# Patient Record
Sex: Male | Born: 1995 | Race: Black or African American | Hispanic: No | Marital: Single | Smoking: Current every day smoker
Health system: Southern US, Community
[De-identification: ages and names within clinical notes are randomized; demographics above are authoritative.]

## PROBLEM LIST (undated history)

## (undated) DIAGNOSIS — I10 Essential (primary) hypertension: Secondary | ICD-10-CM

## (undated) DIAGNOSIS — N200 Calculus of kidney: Secondary | ICD-10-CM

## (undated) HISTORY — PX: ANTERIOR CRUCIATE LIGAMENT REPAIR: SHX115

---
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
## 2016-09-18 ENCOUNTER — Emergency Department (HOSPITAL_BASED_OUTPATIENT_CLINIC_OR_DEPARTMENT_OTHER): Payer: Medicaid Other

## 2016-09-18 ENCOUNTER — Encounter (HOSPITAL_BASED_OUTPATIENT_CLINIC_OR_DEPARTMENT_OTHER): Payer: Self-pay

## 2016-09-18 ENCOUNTER — Emergency Department (HOSPITAL_BASED_OUTPATIENT_CLINIC_OR_DEPARTMENT_OTHER)
Admission: EM | Admit: 2016-09-18 | Discharge: 2016-09-19 | Disposition: A | Discharge status: Home / Self Care | Payer: Medicaid Other | Attending: Emergency Medicine | Admitting: Emergency Medicine

## 2016-09-18 DIAGNOSIS — R0602 Shortness of breath: Secondary | ICD-10-CM | POA: Insufficient documentation

## 2016-09-18 DIAGNOSIS — Z79899 Other long term (current) drug therapy: Secondary | ICD-10-CM | POA: Insufficient documentation

## 2016-09-18 DIAGNOSIS — F17210 Nicotine dependence, cigarettes, uncomplicated: Secondary | ICD-10-CM | POA: Diagnosis not present

## 2016-09-18 DIAGNOSIS — I10 Essential (primary) hypertension: Secondary | ICD-10-CM | POA: Insufficient documentation

## 2016-09-18 DIAGNOSIS — H9201 Otalgia, right ear: Secondary | ICD-10-CM | POA: Diagnosis not present

## 2016-09-18 HISTORY — DX: Essential (primary) hypertension: I10

## 2016-09-18 LAB — CBC WITH DIFFERENTIAL/PLATELET
BASOS ABS: 0 10*3/uL (ref 0.0–0.1)
Basophils Relative: 0 %
Eosinophils Absolute: 0.1 10*3/uL (ref 0.0–0.7)
Eosinophils Relative: 1 %
HCT: 39.4 % (ref 39.0–52.0)
Hemoglobin: 13.6 g/dL (ref 13.0–17.0)
LYMPHS ABS: 4.1 10*3/uL — AB (ref 0.7–4.0)
LYMPHS PCT: 42 %
MCH: 31.9 pg (ref 26.0–34.0)
MCHC: 34.5 g/dL (ref 30.0–36.0)
MCV: 92.5 fL (ref 78.0–100.0)
Monocytes Absolute: 0.7 10*3/uL (ref 0.1–1.0)
Monocytes Relative: 7 %
Neutro Abs: 4.7 10*3/uL (ref 1.7–7.7)
Neutrophils Relative %: 50 %
Platelets: 231 10*3/uL (ref 150–400)
RBC: 4.26 MIL/uL (ref 4.22–5.81)
RDW: 11.6 % (ref 11.5–15.5)
WBC: 9.6 10*3/uL (ref 4.0–10.5)

## 2016-09-18 LAB — BASIC METABOLIC PANEL
ANION GAP: 10 (ref 5–15)
BUN: 17 mg/dL (ref 6–20)
CALCIUM: 9 mg/dL (ref 8.9–10.3)
CO2: 22 mmol/L (ref 22–32)
Chloride: 106 mmol/L (ref 101–111)
Creatinine, Ser: 1.14 mg/dL (ref 0.61–1.24)
GFR calc Af Amer: >60 mL/min (ref >60)
GFR calc non Af Amer: >60 mL/min (ref >60)
Glucose, Bld: 91 mg/dL (ref 65–99)
Potassium: 3.7 mmol/L (ref 3.5–5.1)
Sodium: 138 mmol/L (ref 135–145)

## 2016-09-18 LAB — D-DIMER, QUANTITATIVE: D-Dimer, Quant: <0.27 ug/mL-FEU (ref 0.00–0.50)

## 2016-09-18 LAB — TROPONIN I: Troponin I: <0.03 ng/mL (ref <0.03)

## 2016-09-18 MED ORDER — IPRATROPIUM-ALBUTEROL 0.5-2.5 (3) MG/3ML IN SOLN
3.0000 mL | Freq: Four times a day (QID) | RESPIRATORY_TRACT | Status: DC
  Administered 2016-09-18: 3 mL via RESPIRATORY_TRACT
  Filled 2016-09-18: qty 3

## 2016-09-18 MED ORDER — ALBUTEROL SULFATE HFA 108 (90 BASE) MCG/ACT IN AERS
2.0000 | INHALATION_SPRAY | Freq: Once | RESPIRATORY_TRACT | Status: AC
  Administered 2016-09-18: 2 via RESPIRATORY_TRACT
  Filled 2016-09-18: qty 6.7

## 2016-09-18 MED ORDER — AMOXICILLIN 500 MG PO CAPS: 500.0000 mg | ORAL_CAPSULE | Freq: Three times a day (TID) | ORAL | 0 refills | Status: DC

## 2016-09-18 MED ORDER — PREDNISONE 10 MG PO TABS: 20.0000 mg | ORAL_TABLET | Freq: Every day | ORAL | 0 refills | Status: DC

## 2016-09-18 NOTE — ED Provider Notes (Signed)
[facility] Provider Note   CSN: [id number] Arrival date & time: 09/18/16  1925     History   Chief Complaint Chief Complaint  Patient presents with  . Shortness of Breath    HPI Edward Mcneil is a 21 y.o. male who presents with 1 week of progressively worsening shortness of breath. Patient states that he "feels like he cannot catch his breath." He states that shortness of breath is worsened with exertion. He states that when he has been at work and has been walking around he feels like he can't catch his breath and has to stop before he feels like he runs out of breath. Patient has no history of asthma. Patient is a current smoker and smokes 3 packs of cigarettes a day which he has done for the last 2 years. Patient denies any fever, chest pain, leg swelling, abdominal pain, nausea/vomiting, headache. Patient denies any recent surgery, immobilizations, long travel, history of blood clots. Patient denies any cocaine, heroine, marijuana use.  Of note, patient states he is also complaining of foul-smelling drainage to his right ear. He also notes that he has had intermittent ear pain to the right ear.  The history is provided by the patient.    Past Medical History:  Diagnosis Date  . Hypertension    not compliant    There are no active problems to display for this patient.   Past Surgical History:  Procedure Laterality Date  . ANTERIOR CRUCIATE LIGAMENT REPAIR         Home Medications    Prior to Admission medications   Medication Sig Start Date End Date Taking? Authorizing Provider  amoxicillin (AMOXIL) 500 MG capsule Take 1 capsule (500 mg total) by mouth 3 (three) times daily. 09/18/16   Maxwell Caul, PA-C  ibuprofen (ADVIL,MOTRIN) 800 MG tablet Take 1 tablet (800 mg total) by mouth 3 (three) times daily. 07/25/14   Teressa Lower, NP  omeprazole (PRILOSEC) 40 MG capsule Take one capsule every morning at least 30 minutes before first dose of Carafate.  12/30/15   Molpus, John, MD  predniSONE (DELTASONE) 10 MG tablet Take 2 tablets (20 mg total) by mouth daily. 09/18/16   Maxwell Caul, PA-C  sucralfate (CARAFATE) 1 g tablet Take 1 tablet (1 g total) by mouth 4 (four) times daily -  with meals and at bedtime. 12/30/15   Molpus, John, MD    Family History No family history on file.  Social History Social History  Substance Use Topics  . Smoking status: Current Every Day Smoker  . Smokeless tobacco: Never Used  . Alcohol use No     Allergies   Patient has no known allergies.   Review of Systems Review of Systems  Constitutional: Negative for chills and fever.  HENT: Positive for ear discharge and ear pain. Negative for congestion and rhinorrhea.   Eyes: Negative for visual disturbance.  Respiratory: Positive for shortness of breath. Negative for cough.   Cardiovascular: Negative for chest pain and leg swelling.  Gastrointestinal: Negative for abdominal pain, diarrhea, nausea and vomiting.  Genitourinary: Negative for dysuria and hematuria.     Physical Exam Updated Vital Signs BP (!) 160/127   Pulse 63   Temp 97.8 F (36.6 C) (Oral)   Resp 18   Ht 6' (1.829 m)   Wt 113.4 kg (250 lb)   SpO2 98%   BMI 33.91 kg/m   Physical Exam  Constitutional: He is oriented to person, place, and time.  He appears well-developed and well-nourished.  Sitting comfortably on examination table  HENT:  Head: Normocephalic and atraumatic.  Right Ear: Tympanic membrane is erythematous.  Left Ear: Tympanic membrane normal.  Mouth/Throat: Oropharynx is clear and moist and mucous membranes are normal.  Right TM has a tympanostomy tube present. The TM itself is erythematous.   Eyes: Pupils are equal, round, and reactive to light. Conjunctivae, EOM and lids are normal.  Neck: Full passive range of motion without pain.  Cardiovascular: Regular rhythm, normal heart sounds and normal pulses.  Tachycardia present.  Exam reveals no gallop and no  friction rub.   No murmur heard. Pulmonary/Chest: Effort normal and breath sounds normal. No accessory muscle usage. Tachypnea noted. No respiratory distress. He has no wheezes. He has no rales.  No evidence of respiratory distress. Able to speak in full sentences without difficulty.  Abdominal: Soft. Normal appearance. There is no tenderness. There is no rigidity and no guarding.  Musculoskeletal: Normal range of motion.  No bilateral lower extremity edema, erythema, warmth or tenderness.  Neurological: He is alert and oriented to person, place, and time.  No gait abnormalities  Skin: Skin is warm and dry. Capillary refill takes less than 2 seconds.  Psychiatric: He has a normal mood and affect. His speech is normal.  Nursing note and vitals reviewed.    ED Treatments / Results  Labs (all labs ordered are listed, but only abnormal results are displayed) Labs Reviewed  CBC WITH DIFFERENTIAL/PLATELET - Abnormal; Notable for the following:       Result Value   Lymphs Abs 4.1 (*)    All other components within normal limits  D-DIMER, QUANTITATIVE (NOT AT [facility])  TROPONIN I  BASIC METABOLIC PANEL    EKG  EKG Interpretation  Date/Time:  Friday September 18 2016 21:48:01 EDT Ventricular Rate:  91 PR Interval:    QRS Duration: 108 QT Interval:  337 QTC Calculation: 415 R Axis:   59 Text Interpretation:  Sinus rhythm Probable left atrial enlargement Nonspecific T abnormalities, inferior leads No old tracing to compare Confirmed by Linwood DibblesKnapp, Jon [id number]) on 09/18/2016 10:01:37 PM       Radiology Dg Chest 2 View  Result Date: 09/18/2016 CLINICAL DATA:  Cough, congestion, shortness of breath for 1 week. Smoker. EXAM: CHEST  2 VIEW COMPARISON:  01/25/2016 FINDINGS: The heart size and mediastinal contours are within normal limits. Both lungs are clear. The visualized skeletal structures are unremarkable. IMPRESSION: No active cardiopulmonary disease. Electronically Signed   By: Burman NievesWilliam  Stevens  M.D.   On: 09/18/2016 19:48    Procedures Procedures (including critical care time)  Medications Ordered in ED Medications  albuterol (PROVENTIL HFA;VENTOLIN HFA) 108 (90 Base) MCG/ACT inhaler 2 puff (2 puffs Inhalation Given 09/18/16 2234)     Initial Impression / Assessment and Plan / ED Course  I have reviewed the triage vital signs and the nursing notes.  Pertinent labs & imaging results that were available during my care of the patient were reviewed by me and considered in my medical decision making (see chart for details).     21 year old male who presents with 1 week of progressively worsening shortness of breath. Patient is afebrile, non-toxic appearing, sitting comfortably on examination table. Vital signs reviewed. Patient is slightly tachycardic and tachypnea. No history of asthma but patient is a heavy smoker. Consider bronchitis versus URI. Also concern for PE, given vitals and history. Plan to check chest x-ray and d-dimer for evaluation. Plan to give DuoNeb  in the department to see if that helps the symptoms.  Chest x-ray reviewed. Negative for any acute infectious etiology. D-dimer is negative.  Discussed results with patient. He reports some improvement after the DuoNeb. He is still feeling like he is having some shortness of breath. No evidence of respiratory distress during exam. Throughout talking to him, his O2 sat goes to 94% but then returns greater than 95% on room air. During ED course, O2 sat has been greater than 95% air. Will plan to order additional blood work.  Labs reviewed. BMP unremarkable, CBC unremarkable. Troponin is negative. EKG shows sinus rhythm, rate 91. There is a nonspecific T-wave abnormality in the inferior leads. No priors for comparison. Discussed results with patient. Will plan a bili patient in the department to evaluate O2 sats.  Patient is able to ambulate in the department without difficulty. He had one instance where his O2 sat dropped to  92% for a a second but then returned to greater than 95% on room air. No additional walking test was completed and patient's O2 sat remained greater than 95% on room air. Given unremarkable workup and reassuring ambulation test, patient is stable for discharge at this time. Will plan to send home with prednisone and albuterol for symptomatic relief. Provided patient with a list of clinic resources to use if he does not have a PCP. Instructed to call them today to arrange follow-up in the next 24-48 hours. Given patient's symptoms and exam, we'll plan to treat for acute otitis media. Patient struck to follow-up with referred ENT for evaluation of the tube and possible removal. Return precautions discussed. Patient expresses understanding and agreement to plan.    Final Clinical Impressions(s) / ED Diagnoses   Final diagnoses:  SOB (shortness of breath)    New Prescriptions Discharge Medication List as of 09/18/2016 11:42 PM    START taking these medications   Details  amoxicillin (AMOXIL) 500 MG capsule Take 1 capsule (500 mg total) by mouth 3 (three) times daily., Starting Fri 09/18/2016, Print    predniSONE (DELTASONE) 10 MG tablet Take 2 tablets (20 mg total) by mouth daily., Starting Fri 09/18/2016, Print         Maxwell CaulLayden, Lindsey A, PA-C 09/20/16 [id number]    Knapp, Jon, MD 09/22/16 [phone]

## 2016-09-18 NOTE — ED Notes (Signed)
Patient stated that when he is walking or working, he has difficulty  Breathing.  This has been going on for 2 weeks.   He stated that he also has drainage, milky white,  foul-smelling drainage to his right ear.  He had a tube put in to this affected ear last year.

## 2016-09-18 NOTE — Discharge Instructions (Addendum)
Use inhaler as directed.  Take the Prednisone as prescribed.   Take antibiotics as directed. Please take all of your antibiotics until finished. Follow-up with the referred ENT doctor for evaluation of the ear and removal of the tube.   Follow-up with the referred primary care doctor for further evaluation. You need to see them for evaluation of your blood pressure since it was elevated today.   Attempt to cut down your smoking with eventual goal of quitting.   Return to the Emergency Department for any chest pain, difficulty breathing, dizziness, nausea/vomiting or any other worsening or concerning symptoms.

## 2016-09-18 NOTE — ED Triage Notes (Signed)
Pt c/o SOB and that he cannot catch his breath for the last week.  He has a dry cough, denies fevers, lungs are clear, denies chest pain, denies edema, heavy smoker

## 2016-09-18 NOTE — Progress Notes (Signed)
RT ambulated around the department while on pulse ox.  Patient's SPO2 remained between 93% and 97%

## 2016-09-19 NOTE — ED Notes (Signed)
Advised patient to find a primary physician.  He verbalized understanding.

## 2017-05-16 ENCOUNTER — Emergency Department (HOSPITAL_BASED_OUTPATIENT_CLINIC_OR_DEPARTMENT_OTHER)
Admission: EM | Admit: 2017-05-16 | Discharge: 2017-05-16 | Disposition: A | Discharge status: Home / Self Care | Payer: Medicaid Other | Attending: Emergency Medicine | Admitting: Emergency Medicine

## 2017-05-16 ENCOUNTER — Emergency Department (HOSPITAL_BASED_OUTPATIENT_CLINIC_OR_DEPARTMENT_OTHER): Payer: Medicaid Other

## 2017-05-16 ENCOUNTER — Other Ambulatory Visit: Payer: Self-pay

## 2017-05-16 ENCOUNTER — Encounter (HOSPITAL_BASED_OUTPATIENT_CLINIC_OR_DEPARTMENT_OTHER): Payer: Self-pay | Admitting: Emergency Medicine

## 2017-05-16 DIAGNOSIS — F17200 Nicotine dependence, unspecified, uncomplicated: Secondary | ICD-10-CM | POA: Insufficient documentation

## 2017-05-16 DIAGNOSIS — Z79899 Other long term (current) drug therapy: Secondary | ICD-10-CM | POA: Diagnosis not present

## 2017-05-16 DIAGNOSIS — J209 Acute bronchitis, unspecified: Secondary | ICD-10-CM | POA: Diagnosis not present

## 2017-05-16 DIAGNOSIS — R05 Cough: Secondary | ICD-10-CM | POA: Diagnosis present

## 2017-05-16 DIAGNOSIS — I10 Essential (primary) hypertension: Secondary | ICD-10-CM

## 2017-05-16 MED ORDER — ALBUTEROL SULFATE (2.5 MG/3ML) 0.083% IN NEBU
2.5000 mg | INHALATION_SOLUTION | Freq: Once | RESPIRATORY_TRACT | Status: AC
  Administered 2017-05-16: 2.5 mg via RESPIRATORY_TRACT
  Filled 2017-05-16: qty 3

## 2017-05-16 MED ORDER — IPRATROPIUM-ALBUTEROL 0.5-2.5 (3) MG/3ML IN SOLN
3.0000 mL | Freq: Once | RESPIRATORY_TRACT | Status: AC
  Administered 2017-05-16: 3 mL via RESPIRATORY_TRACT
  Filled 2017-05-16: qty 3

## 2017-05-16 MED ORDER — ALBUTEROL SULFATE (2.5 MG/3ML) 0.083% IN NEBU
5.0000 mg | INHALATION_SOLUTION | Freq: Once | RESPIRATORY_TRACT | Status: AC
  Administered 2017-05-16: 5 mg via RESPIRATORY_TRACT
  Filled 2017-05-16: qty 6

## 2017-05-16 MED ORDER — ALBUTEROL SULFATE HFA 108 (90 BASE) MCG/ACT IN AERS
1.0000 | INHALATION_SPRAY | Freq: Four times a day (QID) | RESPIRATORY_TRACT | Status: DC | PRN
  Administered 2017-05-16: 2 via RESPIRATORY_TRACT
  Filled 2017-05-16: qty 6.7

## 2017-05-16 MED ORDER — AMLODIPINE BESYLATE 5 MG PO TABS: 5.0000 mg | ORAL_TABLET | Freq: Every day | ORAL | 0 refills | Status: DC

## 2017-05-16 MED ORDER — AZITHROMYCIN 250 MG PO TABS
500.0000 mg | ORAL_TABLET | Freq: Once | ORAL | Status: AC
  Administered 2017-05-16: 500 mg via ORAL
  Filled 2017-05-16: qty 2

## 2017-05-16 MED ORDER — ALBUTEROL SULFATE HFA 108 (90 BASE) MCG/ACT IN AERS: 2.0000 | INHALATION_SPRAY | RESPIRATORY_TRACT | 1 refills | Status: DC | PRN

## 2017-05-16 MED ORDER — AZITHROMYCIN 250 MG PO TABS: 250.0000 mg | ORAL_TABLET | Freq: Every day | ORAL | 0 refills | Status: DC

## 2017-05-16 MED ORDER — GUAIFENESIN 100 MG/5ML PO SOLN
15.0000 mL | Freq: Once | ORAL | Status: AC
  Administered 2017-05-16: 300 mg via ORAL
  Filled 2017-05-16: qty 20

## 2017-05-16 NOTE — ED Notes (Signed)
Patient transported to X-ray 

## 2017-05-16 NOTE — Discharge Instructions (Addendum)
It was our pleasure to provide your ER care today - we hope that you feel better.  Your blood pressure is high - take amlodipine as prescribed, and follow up with primary care doctor in the next couple weeks.  Take zithromax (antibiotic) as prescribed.  Use albuterol inhaler as need.  Return to ER if worse, increased trouble breathing, other concern.

## 2017-05-16 NOTE — ED Triage Notes (Signed)
Cough with SOB x 1 week.

## 2017-05-16 NOTE — ED Provider Notes (Signed)
[facility] EMERGENCY DEPARTMENT Provider Note   CSN: [id number] Arrival date & time: 05/16/17  0710     History   Chief Complaint Chief Complaint  Patient presents with  . Cough  . Shortness of Breath    HPI Edward Mcneil is a 22 y.o. male.  Patient with hx htn, presents w non productive cough, congestion, and sob x 1 week. Symptoms gradual in onset, persistent, slowly worse. Symptoms worse when exposed to dust/dusty air. Transient improvement w albuterol neb in ED. Notes hx bronchitis/wheezing w bronchitis in past. Hx hypertension, but has not been taking any bp med for several months. Denies leg swelling or pain. Denies chest pain. No sore throat.   The history is provided by the patient.  Cough  Associated symptoms include shortness of breath and wheezing. Pertinent negatives include no chest pain, no headaches, no sore throat and no eye redness.  Shortness of Breath  Associated symptoms include cough and wheezing. Pertinent negatives include no fever, no headaches, no sore throat, no neck pain, no chest pain, no vomiting, no abdominal pain, no rash and no leg swelling.    Past Medical History:  Diagnosis Date  . Hypertension    not compliant    There are no active problems to display for this patient.   Past Surgical History:  Procedure Laterality Date  . ANTERIOR CRUCIATE LIGAMENT REPAIR          Home Medications    Prior to Admission medications   Medication Sig Start Date End Date Taking? Authorizing Provider  amoxicillin (AMOXIL) 500 MG capsule Take 1 capsule (500 mg total) by mouth 3 (three) times daily. 09/18/16   Maxwell CaulLayden, Lindsey A, PA-C  ibuprofen (ADVIL,MOTRIN) 800 MG tablet Take 1 tablet (800 mg total) by mouth 3 (three) times daily. 07/25/14   Teressa LowerPickering, Vrinda, NP  omeprazole (PRILOSEC) 40 MG capsule Take one capsule every morning at least 30 minutes before first dose of Carafate. 12/30/15   Molpus, John, MD  predniSONE (DELTASONE) 10 MG  tablet Take 2 tablets (20 mg total) by mouth daily. 09/18/16   Maxwell CaulLayden, Lindsey A, PA-C  sucralfate (CARAFATE) 1 g tablet Take 1 tablet (1 g total) by mouth 4 (four) times daily -  with meals and at bedtime. 12/30/15   Molpus, John, MD    Family History No family history on file.  Social History Social History   Tobacco Use  . Smoking status: Current Every Day Smoker  . Smokeless tobacco: Never Used  Substance Use Topics  . Alcohol use: No  . Drug use: No     Allergies   Patient has no known allergies.   Review of Systems Review of Systems  Constitutional: Negative for fever.  HENT: Negative for sore throat.   Eyes: Negative for redness.  Respiratory: Positive for cough, shortness of breath and wheezing.   Cardiovascular: Negative for chest pain and leg swelling.  Gastrointestinal: Negative for abdominal pain and vomiting.  Genitourinary: Negative for flank pain.  Musculoskeletal: Negative for neck pain.  Skin: Negative for rash.  Neurological: Negative for headaches.  Hematological: Does not bruise/bleed easily.  Psychiatric/Behavioral: Negative for confusion.     Physical Exam Updated Vital Signs BP (!) 176/112 Comment: cannot afford BP meds  Pulse 88   Temp 98.4 F (36.9 C) (Oral)   Resp (!) 22   Ht 1.829 m (6')   Wt 122.5 kg (270 lb)   SpO2 98%   BMI 36.62 kg/m   Physical  Exam  Constitutional: He appears well-developed and well-nourished. No distress.  Pt coughing, sounds congested.   HENT:  Mouth/Throat: Oropharynx is clear and moist.  Eyes: Conjunctivae are normal.  Neck: Neck supple. No tracheal deviation present.  Cardiovascular: Normal rate, regular rhythm, normal heart sounds and intact distal pulses. Exam reveals no gallop and no friction rub.  No murmur heard. Pulmonary/Chest: Effort normal. No accessory muscle usage. No respiratory distress. He has wheezes.  Abdominal: Soft. He exhibits no distension. There is no tenderness.  Musculoskeletal:  He exhibits no edema or tenderness.  Neurological: He is alert.  Skin: Skin is warm and dry. He is not diaphoretic.  Psychiatric: He has a normal mood and affect.  Nursing note and vitals reviewed.    ED Treatments / Results  Labs (all labs ordered are listed, but only abnormal results are displayed) Results for orders placed or performed during the hospital encounter of 09/18/16  D-dimer, quantitative (not at [facility])  Result Value Ref Range   D-Dimer, Quant <0.27 0.00 - 0.50 ug/mL-FEU  Troponin I  Result Value Ref Range   Troponin I <0.03 <0.03 ng/mL  CBC with Differential  Result Value Ref Range   WBC 9.6 4.0 - 10.5 K/uL   RBC 4.26 4.22 - 5.81 MIL/uL   Hemoglobin 13.6 13.0 - 17.0 g/dL   HCT 16.1 09.6 - 04.5 %   MCV 92.5 78.0 - 100.0 fL   MCH 31.9 26.0 - 34.0 pg   MCHC 34.5 30.0 - 36.0 g/dL   RDW 40.9 81.1 - 91.4 %   Platelets 231 150 - 400 K/uL   Neutrophils Relative % 50 %   Neutro Abs 4.7 1.7 - 7.7 K/uL   Lymphocytes Relative 42 %   Lymphs Abs 4.1 (H) 0.7 - 4.0 K/uL   Monocytes Relative 7 %   Monocytes Absolute 0.7 0.1 - 1.0 K/uL   Eosinophils Relative 1 %   Eosinophils Absolute 0.1 0.0 - 0.7 K/uL   Basophils Relative 0 %   Basophils Absolute 0.0 0.0 - 0.1 K/uL  Basic metabolic panel  Result Value Ref Range   Sodium 138 135 - 145 mmol/L   Potassium 3.7 3.5 - 5.1 mmol/L   Chloride 106 101 - 111 mmol/L   CO2 22 22 - 32 mmol/L   Glucose, Bld 91 65 - 99 mg/dL   BUN 17 6 - 20 mg/dL   Creatinine, Ser 7.82 0.61 - 1.24 mg/dL   Calcium 9.0 8.9 - 95.6 mg/dL   GFR calc non Af Amer >60 >60 mL/min   GFR calc Af Amer >60 >60 mL/min   Anion gap 10 5 - 15   Dg Chest 2 View  Result Date: 05/16/2017 CLINICAL DATA:  Productive cough and shortness of breath for 1 week, smoker EXAM: CHEST - 2 VIEW COMPARISON:  09/18/2016 FINDINGS: Normal heart size, mediastinal contours, and pulmonary vascularity. Mild peribronchial thickening. No pulmonary infiltrate, pleural effusion, or  pneumothorax. Bones unremarkable. IMPRESSION: Bronchitic changes without infiltrate. Electronically Signed   By: Ulyses Southward M.D.   On: 05/16/2017 08:46    EKG None  Radiology No results found.  Procedures Procedures (including critical care time)  Medications Ordered in ED Medications  ipratropium-albuterol (DUONEB) 0.5-2.5 (3) MG/3ML nebulizer solution 3 mL (3 mLs Nebulization Given 05/16/17 0725)  albuterol (PROVENTIL) (2.5 MG/3ML) 0.083% nebulizer solution 2.5 mg (2.5 mg Nebulization Given 05/16/17 0725)     Initial Impression / Assessment and Plan / ED Course  I have reviewed the triage  vital signs and the nursing notes.  Pertinent labs & imaging results that were available during my care of the patient were reviewed by me and considered in my medical decision making (see chart for details).  Albuterol/atrovent neb.  Cxr.  Reviewed nursing notes and prior charts for additional history.   Repeat bp.  Recheck no wheezing.    bp improved.   No increased wob. No cp.   Reviewed xrays - no pna, discussed w pt.    Final Clinical Impressions(s) / ED Diagnoses   Final diagnoses:  None    ED Discharge Orders    None       Cathren Laine, MD 05/25/17 [phone]

## 2017-08-21 ENCOUNTER — Other Ambulatory Visit: Payer: Self-pay

## 2017-08-21 ENCOUNTER — Encounter (HOSPITAL_BASED_OUTPATIENT_CLINIC_OR_DEPARTMENT_OTHER): Payer: Self-pay | Admitting: Emergency Medicine

## 2017-08-21 DIAGNOSIS — H9201 Otalgia, right ear: Secondary | ICD-10-CM | POA: Insufficient documentation

## 2017-08-21 DIAGNOSIS — Z5321 Procedure and treatment not carried out due to patient leaving prior to being seen by health care provider: Secondary | ICD-10-CM | POA: Insufficient documentation

## 2017-08-21 NOTE — ED Triage Notes (Signed)
Patient states that he has pain to his right ear. He went to the urgent care yesterday and he states that the medications that he was given made it worse

## 2017-08-22 ENCOUNTER — Emergency Department (HOSPITAL_BASED_OUTPATIENT_CLINIC_OR_DEPARTMENT_OTHER)
Admission: EM | Admit: 2017-08-22 | Discharge: 2017-08-22 | Disposition: A | Discharge status: Home / Self Care | Payer: Medicaid Other | Attending: Emergency Medicine | Admitting: Emergency Medicine

## 2017-08-22 NOTE — ED Notes (Signed)
PT did not answer when name was called x2

## 2017-11-08 ENCOUNTER — Emergency Department (HOSPITAL_BASED_OUTPATIENT_CLINIC_OR_DEPARTMENT_OTHER): Payer: Managed Care, Other (non HMO)

## 2017-11-08 ENCOUNTER — Emergency Department (HOSPITAL_BASED_OUTPATIENT_CLINIC_OR_DEPARTMENT_OTHER)
Admission: EM | Admit: 2017-11-08 | Discharge: 2017-11-08 | Disposition: A | Discharge status: Home / Self Care | Payer: Managed Care, Other (non HMO) | Attending: Emergency Medicine | Admitting: Emergency Medicine

## 2017-11-08 ENCOUNTER — Other Ambulatory Visit: Payer: Self-pay

## 2017-11-08 ENCOUNTER — Encounter (HOSPITAL_BASED_OUTPATIENT_CLINIC_OR_DEPARTMENT_OTHER): Payer: Self-pay

## 2017-11-08 DIAGNOSIS — J069 Acute upper respiratory infection, unspecified: Secondary | ICD-10-CM | POA: Diagnosis not present

## 2017-11-08 DIAGNOSIS — R079 Chest pain, unspecified: Secondary | ICD-10-CM | POA: Diagnosis not present

## 2017-11-08 DIAGNOSIS — F17200 Nicotine dependence, unspecified, uncomplicated: Secondary | ICD-10-CM | POA: Diagnosis not present

## 2017-11-08 DIAGNOSIS — R0981 Nasal congestion: Secondary | ICD-10-CM | POA: Diagnosis not present

## 2017-11-08 DIAGNOSIS — R0602 Shortness of breath: Secondary | ICD-10-CM | POA: Insufficient documentation

## 2017-11-08 DIAGNOSIS — Z79899 Other long term (current) drug therapy: Secondary | ICD-10-CM | POA: Diagnosis not present

## 2017-11-08 DIAGNOSIS — I10 Essential (primary) hypertension: Secondary | ICD-10-CM | POA: Diagnosis not present

## 2017-11-08 DIAGNOSIS — R05 Cough: Secondary | ICD-10-CM | POA: Diagnosis present

## 2017-11-08 LAB — GROUP A STREP BY PCR: Group A Strep by PCR: NOT DETECTED

## 2017-11-08 MED ORDER — CETIRIZINE HCL 10 MG PO TABS: 10.0000 mg | ORAL_TABLET | Freq: Every day | ORAL | 0 refills | Status: AC

## 2017-11-08 MED ORDER — BENZONATATE 100 MG PO CAPS: 100.0000 mg | ORAL_CAPSULE | Freq: Three times a day (TID) | ORAL | 0 refills | Status: DC

## 2017-11-08 MED ORDER — AMLODIPINE BESYLATE 5 MG PO TABS
5.0000 mg | ORAL_TABLET | Freq: Once | ORAL | Status: AC
  Administered 2017-11-08: 5 mg via ORAL
  Filled 2017-11-08: qty 1

## 2017-11-08 MED ORDER — AMLODIPINE BESYLATE 10 MG PO TABS: 10.0000 mg | ORAL_TABLET | Freq: Every day | ORAL | 0 refills | Status: DC

## 2017-11-08 NOTE — ED Notes (Signed)
ED Provider at bedside. 

## 2017-11-08 NOTE — Discharge Instructions (Signed)
Take Tessalon every 8 hours as needed for cough.  Take Zyrtec once daily to help with your nasal congestion.  We will increase your dose of Norvasc, but it is important to follow-up with a primary care provider for further evaluation of your blood pressure as well as the substance seen in your throat.  This could be related to your illness today, however it is important that it be reevaluated when you are feeling better.  Please return to the emergency department if you develop any new or worsening symptoms.

## 2017-11-08 NOTE — ED Provider Notes (Signed)
[facility] EMERGENCY DEPARTMENT Provider Note   CSN: [id number] Arrival date & time: 11/08/17  1954     History   Chief Complaint Chief Complaint  Patient presents with  . Cough    HPI Edward Mcneil is a 22 y.o. male with history of hypertension noncompliance who presents with a 2-day history of cough, nasal congestion, intermittent chest tightness and shortness of breath.  Patient has been taking Vicks VapoRub and DayQuil without relief.  He reports he smokes a lot.  He denies having a PCP.  He reports he has been taking his Norvasc at home for hypertension, however he states it does not work.  He denies any chest pain, abdominal pain, nausea, vomiting, fevers at home.  He denies any new sore throat, but states he typically has a sore throat with smoking.  He denies any ear pain.  HPI  Past Medical History:  Diagnosis Date  . Hypertension    not compliant    There are no active problems to display for this patient.   Past Surgical History:  Procedure Laterality Date  . ANTERIOR CRUCIATE LIGAMENT REPAIR          Home Medications    Prior to Admission medications   Medication Sig Start Date End Date Taking? Authorizing Provider  albuterol (PROVENTIL HFA;VENTOLIN HFA) 108 (90 Base) MCG/ACT inhaler Inhale 2 puffs into the lungs every 4 (four) hours as needed for wheezing or shortness of breath. 05/16/17   Cathren LaineSteinl, Kevin, MD  amLODipine (NORVASC) 10 MG tablet Take 1 tablet (10 mg total) by mouth daily. 11/08/17   Jadaya Sommerfield, Waylan BogaAlexandra M, PA-C  benzonatate (TESSALON) 100 MG capsule Take 1 capsule (100 mg total) by mouth every 8 (eight) hours. 11/08/17   Taleeya Blondin, Waylan BogaAlexandra M, PA-C  cetirizine (ZYRTEC ALLERGY) 10 MG tablet Take 1 tablet (10 mg total) by mouth daily. 11/08/17   Artemisa Sladek, Waylan BogaAlexandra M, PA-C  ibuprofen (ADVIL,MOTRIN) 800 MG tablet Take 1 tablet (800 mg total) by mouth 3 (three) times daily. 07/25/14   Teressa LowerPickering, Vrinda, NP  omeprazole (PRILOSEC) 40 MG capsule Take one  capsule every morning at least 30 minutes before first dose of Carafate. 12/30/15   Molpus, John, MD  sucralfate (CARAFATE) 1 g tablet Take 1 tablet (1 g total) by mouth 4 (four) times daily -  with meals and at bedtime. 12/30/15   Molpus, John, MD    Family History No family history on file.  Social History Social History   Tobacco Use  . Smoking status: Current Every Day Smoker  . Smokeless tobacco: Never Used  Substance Use Topics  . Alcohol use: Yes    Comment: occ  . Drug use: No     Allergies   Patient has no known allergies.   Review of Systems Review of Systems  Constitutional: Negative for chills and fever.  HENT: Positive for congestion. Negative for facial swelling and sore throat.   Respiratory: Positive for cough, chest tightness and shortness of breath.   Cardiovascular: Negative for chest pain.  Gastrointestinal: Negative for abdominal pain, nausea and vomiting.  Genitourinary: Negative for dysuria.  Musculoskeletal: Negative for back pain.  Skin: Negative for rash and wound.  Neurological: Negative for headaches.  Psychiatric/Behavioral: The patient is not nervous/anxious.      Physical Exam Updated Vital Signs BP (!) 158/119 (BP Location: Right Arm)   Pulse 97   Temp 98.3 F (36.8 C) (Oral)   Resp 14   Ht 6' (1.829 m)  Wt (!) 149.2 kg   SpO2 98%   BMI 44.62 kg/m   Physical Exam  Constitutional: He appears well-developed and well-nourished. No distress.  HENT:  Head: Normocephalic and atraumatic.  Left Ear: Tympanic membrane normal.  Mouth/Throat: Posterior oropharyngeal edema and posterior oropharyngeal erythema present. No oropharyngeal exudate or tonsillar abscesses.    Tympanostomy tube noted to the right TM with erythema and no drainage  Eyes: Pupils are equal, round, and reactive to light. Conjunctivae are normal. Right eye exhibits no discharge. Left eye exhibits no discharge. No scleral icterus.  Neck: Normal range of motion. Neck  supple. No thyromegaly present.  Cardiovascular: Normal rate, regular rhythm, normal heart sounds and intact distal pulses. Exam reveals no gallop and no friction rub.  No murmur heard. Pulmonary/Chest: Effort normal. No stridor. No respiratory distress. He has no wheezes. He has rales (R base).  Abdominal: Soft. Bowel sounds are normal. He exhibits no distension. There is no tenderness. There is no rebound and no guarding.  Musculoskeletal: He exhibits no edema.  Lymphadenopathy:    He has no cervical adenopathy.  Neurological: He is alert. Coordination normal.  Skin: Skin is warm and dry. No rash noted. He is not diaphoretic. No pallor.  Psychiatric: He has a normal mood and affect.  Nursing note and vitals reviewed.    ED Treatments / Results  Labs (all labs ordered are listed, but only abnormal results are displayed) Labs Reviewed  GROUP A STREP BY PCR    EKG None  Radiology Dg Chest 2 View  Result Date: 11/08/2017 CLINICAL DATA:  22 year old male with productive cough and body aches for 3 days. EXAM: CHEST - 2 VIEW COMPARISON:  Chest radiographs 05/16/2017 and earlier. FINDINGS: Lung volumes and mediastinal contours remain normal. Visualized tracheal air column is within normal limits. The lungs appear stable in clear. No pneumothorax or pleural effusion. No osseous abnormality identified. Negative visible bowel gas pattern. IMPRESSION: Negative.  No cardiopulmonary abnormality. Electronically Signed   By: Odessa Fleming M.D.   On: 11/08/2017 20:58    Procedures Procedures (including critical care time)  Medications Ordered in ED Medications  amLODipine (NORVASC) tablet 5 mg (5 mg Oral Given 11/08/17 2034)     Initial Impression / Assessment and Plan / ED Course  I have reviewed the triage vital signs and the nursing notes.  Pertinent labs & imaging results that were available during my care of the patient were reviewed by me and considered in my medical decision making (see  chart for details).     Patient presenting with cough, sore throat, body aches.  Chest x-ray is negative.  Strep PCR is negative.  Suspect viral syndrome.  Patient also with elevated blood pressure despite taking Norvasc 5 mg.  Will increase Norvasc to 10 mg and advise follow-up to PCP.  Will treat cough with Tessalon nasal congestion with Zyrtec.  Return precautions discussed.  Smoking cessation discussed.  Patient understands and agrees with plan.  Patient's blood pressure decreased to 158/119 after second dose of Norvasc 5 mg in the ED.  Patient vitals stable and discharged in satisfactory condition.  Final Clinical Impressions(s) / ED Diagnoses   Final diagnoses:  Upper respiratory tract infection, unspecified type    ED Discharge Orders         Ordered    amLODipine (NORVASC) 10 MG tablet  Daily     11/08/17 2141    benzonatate (TESSALON) 100 MG capsule  Every 8 hours  11/08/17 2141    cetirizine (ZYRTEC ALLERGY) 10 MG tablet  Daily     11/08/17 2141           Emi Holes, PA-C 11/08/17 2345    Charlynne Pander, MD 11/09/17 [phone]

## 2017-11-08 NOTE — ED Triage Notes (Signed)
C/o flu like sx x 2 days-NAD-steady gait 

## 2017-11-08 NOTE — ED Notes (Signed)
Patient transported to X-ray 

## 2017-11-08 NOTE — ED Notes (Signed)
C/o cough, congestion, sinus pressure x 2-3 days

## 2017-11-08 NOTE — ED Notes (Signed)
PT discharged to home with family. NAD. 

## 2018-12-29 ENCOUNTER — Ambulatory Visit (INDEPENDENT_AMBULATORY_CARE_PROVIDER_SITE_OTHER): Payer: Self-pay

## 2018-12-29 ENCOUNTER — Other Ambulatory Visit: Payer: Self-pay

## 2018-12-29 ENCOUNTER — Other Ambulatory Visit: Payer: Self-pay | Admitting: Occupational Medicine

## 2018-12-29 DIAGNOSIS — Z021 Encounter for pre-employment examination: Secondary | ICD-10-CM

## 2019-03-02 IMAGING — CR DG CHEST 2V
2 series · 2 of 2 positions shown · non-contrast
Comparison: 09/18/2016

CLINICAL DATA: Productive cough and shortness of breath for 1 week,
smoker

EXAM:
CHEST - 2 VIEW

[w chest pa]
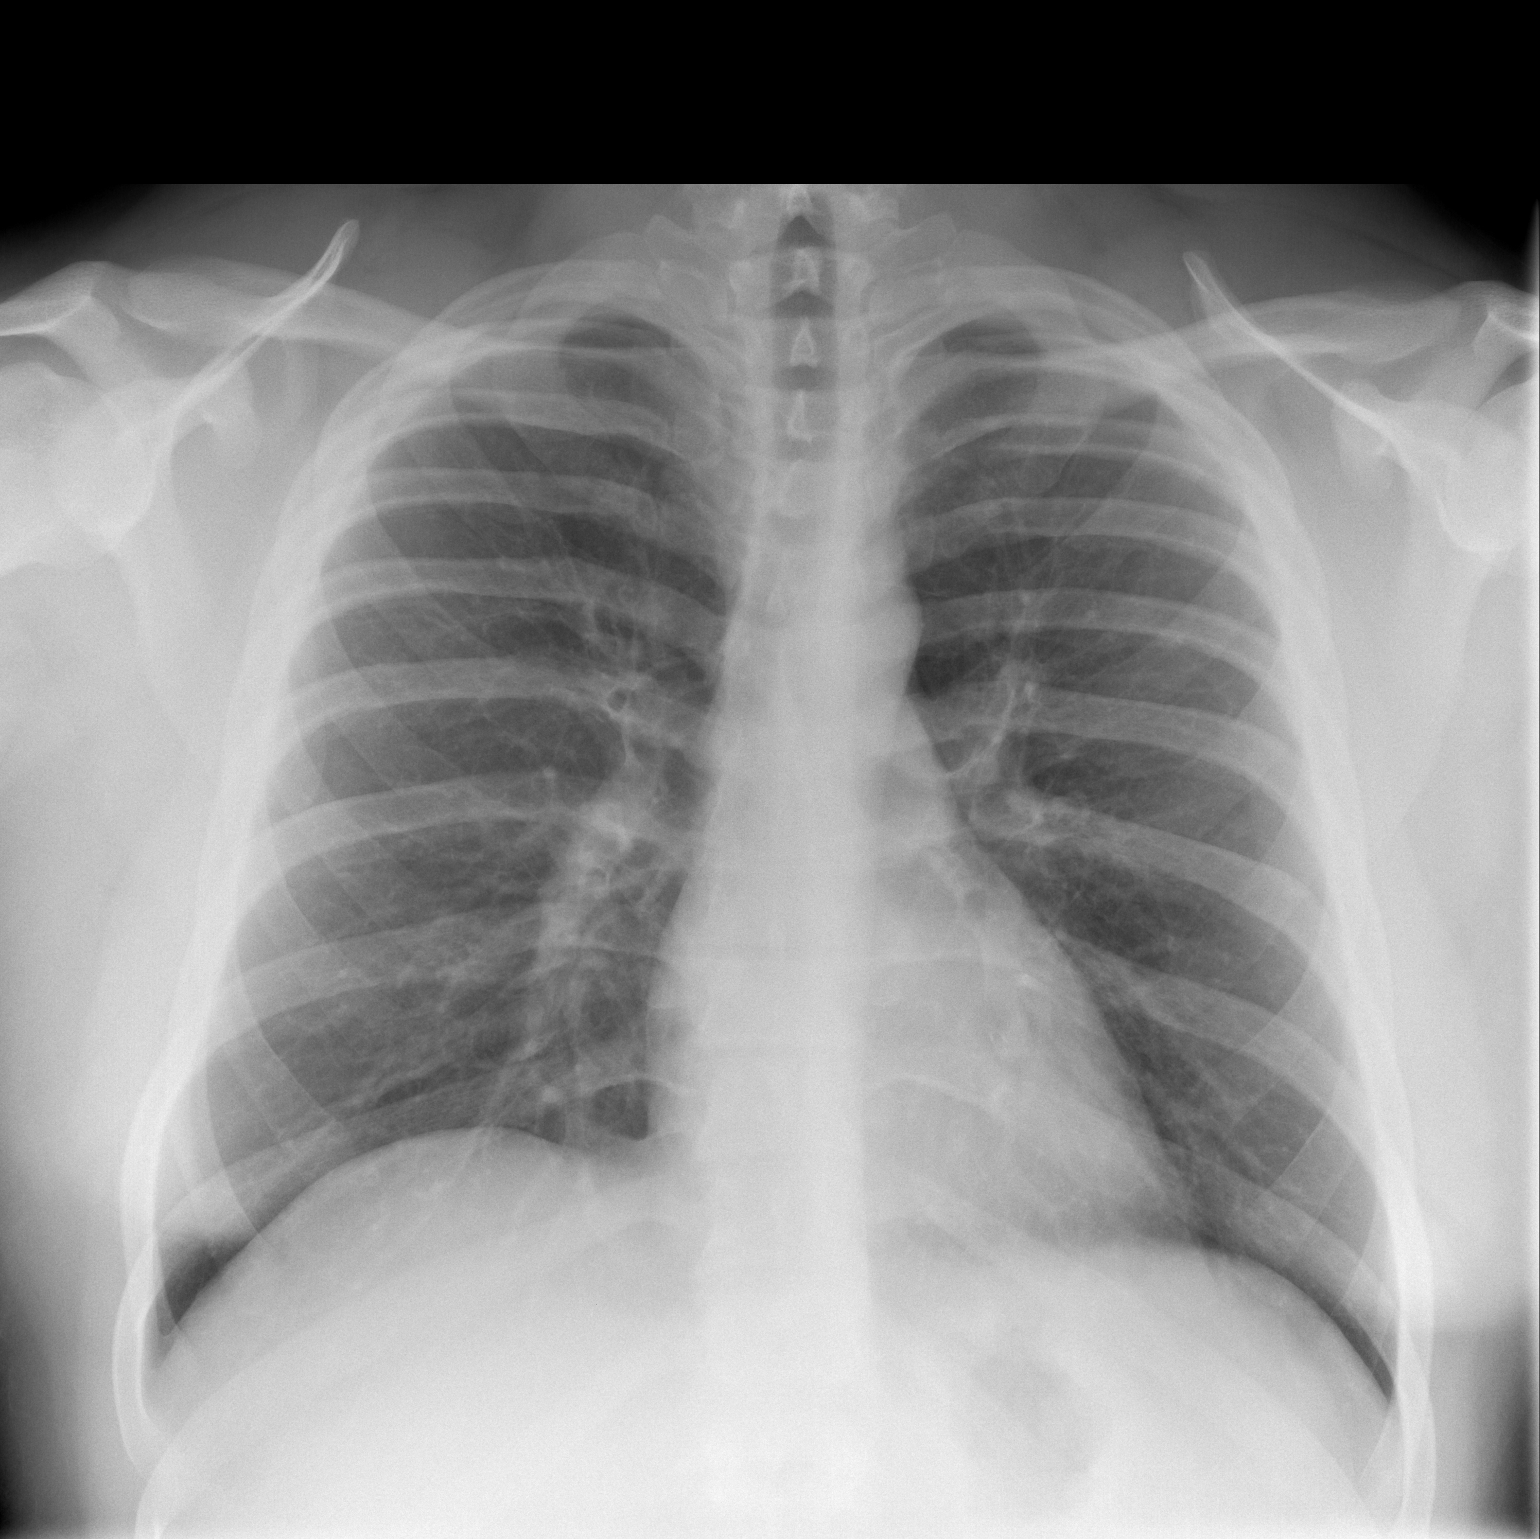

[w chest lat]
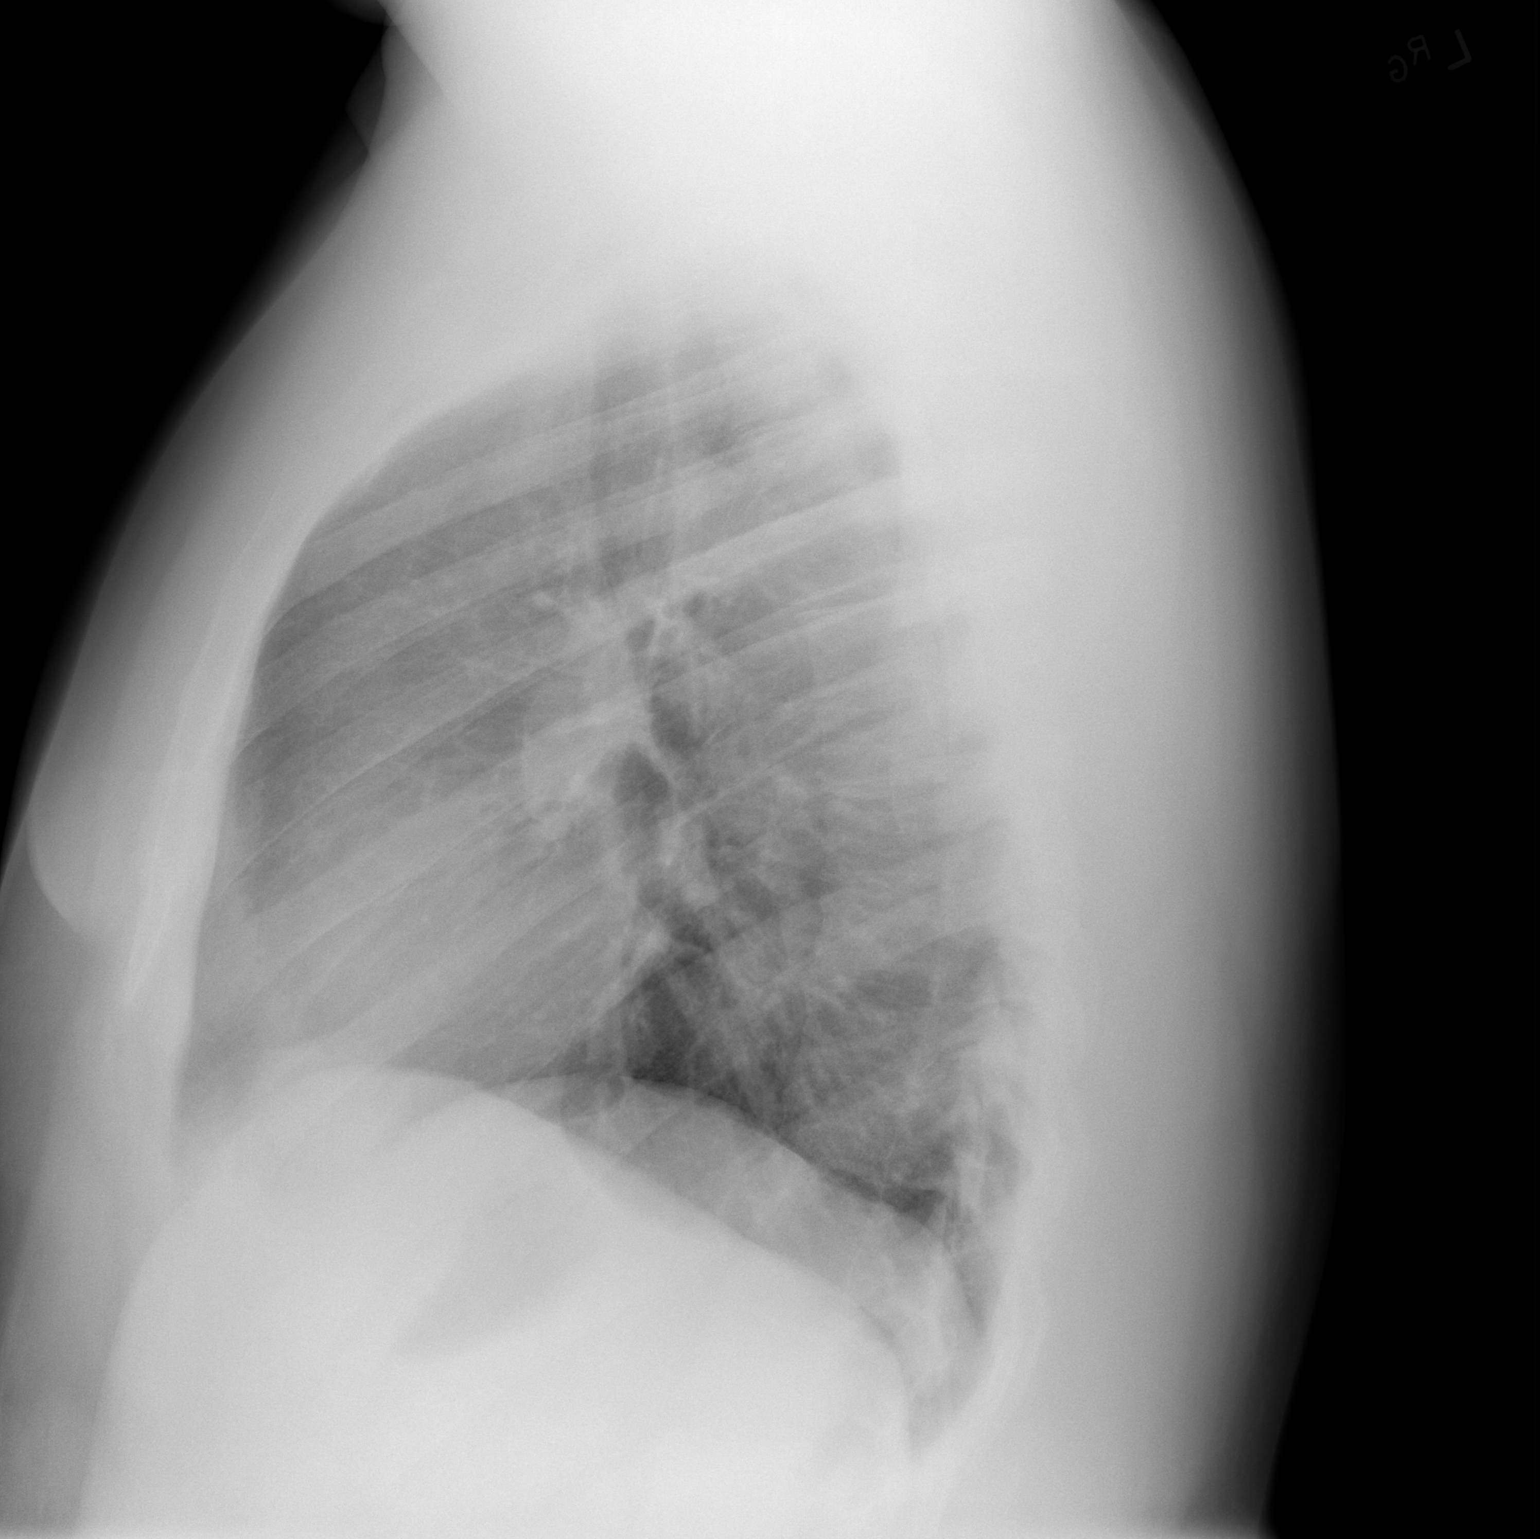

[2 of 2 positions shown; findings below may reference images not displayed]

FINDINGS: Normal heart size, mediastinal contours, and pulmonary vascularity.

Mild peribronchial thickening.

No pulmonary infiltrate, pleural effusion, or pneumothorax.

Bones unremarkable.
IMPRESSION: Bronchitic changes without infiltrate.

## 2019-08-03 ENCOUNTER — Encounter (HOSPITAL_BASED_OUTPATIENT_CLINIC_OR_DEPARTMENT_OTHER): Payer: Self-pay

## 2019-08-03 ENCOUNTER — Other Ambulatory Visit: Payer: Self-pay

## 2019-08-03 ENCOUNTER — Emergency Department (HOSPITAL_BASED_OUTPATIENT_CLINIC_OR_DEPARTMENT_OTHER)
Admission: EM | Admit: 2019-08-03 | Discharge: 2019-08-03 | Disposition: A | Discharge status: Home / Self Care | Payer: Managed Care, Other (non HMO) | Attending: Emergency Medicine | Admitting: Emergency Medicine

## 2019-08-03 DIAGNOSIS — F17200 Nicotine dependence, unspecified, uncomplicated: Secondary | ICD-10-CM | POA: Insufficient documentation

## 2019-08-03 DIAGNOSIS — I10 Essential (primary) hypertension: Secondary | ICD-10-CM | POA: Insufficient documentation

## 2019-08-03 DIAGNOSIS — J209 Acute bronchitis, unspecified: Secondary | ICD-10-CM | POA: Insufficient documentation

## 2019-08-03 MED ORDER — ALBUTEROL SULFATE HFA 108 (90 BASE) MCG/ACT IN AERS: 2.0000 | INHALATION_SPRAY | Freq: Four times a day (QID) | RESPIRATORY_TRACT | 0 refills | Status: DC | PRN

## 2019-08-03 MED ORDER — ALBUTEROL SULFATE HFA 108 (90 BASE) MCG/ACT IN AERS
2.0000 | INHALATION_SPRAY | Freq: Once | RESPIRATORY_TRACT | Status: AC
  Administered 2019-08-03: 2 via RESPIRATORY_TRACT
  Filled 2019-08-03: qty 6.7

## 2019-08-03 MED ORDER — BENZONATATE 100 MG PO CAPS: 100.0000 mg | ORAL_CAPSULE | Freq: Three times a day (TID) | ORAL | 0 refills | Status: DC | PRN

## 2019-08-03 MED ORDER — DEXAMETHASONE SODIUM PHOSPHATE 10 MG/ML IJ SOLN
10.0000 mg | Freq: Once | INTRAMUSCULAR | Status: AC
  Administered 2019-08-03: 10 mg via INTRAMUSCULAR
  Filled 2019-08-03: qty 1

## 2019-08-03 NOTE — Patient Instructions (Signed)
RT instructed patient in the correct use of a MDI with a spacer.  Patient demonstrated the correct technique using a MDI with a spacer. 

## 2019-08-03 NOTE — ED Provider Notes (Signed)
Emergency Department Provider Note   I have reviewed the triage vital signs and the nursing notes.   HISTORY  Chief Complaint Cough   HPI Edward Mcneil is a 24 y.o. male with PMH of HTN and frequent bronchitis with COVID diagnosis 1 month prior presents to the ED with cough.  The patient states that he frequently gets bronchitis type symptoms and typically improves with albuterol.  He is out of his albuterol and has been out of his blood pressure medicine for the past 2 days.  Patient states that he will be able to refill his meds as he gets paid at 12 PM tonight.  His cough with clear sputum began today after working around [employer].  He denies any chest pain or heart palpitations. No fever. No chills. No radiation of symptoms or modifying factors.    Past Medical History:  Diagnosis Date  . Hypertension    not compliant    There are no problems to display for this patient.   Past Surgical History:  Procedure Laterality Date  . ANTERIOR CRUCIATE LIGAMENT REPAIR      Allergies Patient has no known allergies.  No family history on file.  Social History Social History   Tobacco Use  . Smoking status: Current Every Day Smoker  . Smokeless tobacco: Never Used  Vaping Use  . Vaping Use: Never used  Substance Use Topics  . Alcohol use: Yes    Comment: occ  . Drug use: No    Review of Systems  Constitutional: No fever/chills Eyes: No visual changes. ENT: No sore throat. Cardiovascular: Denies chest pain. Respiratory: Denies shortness of breath. Positive cough.  Gastrointestinal: No abdominal pain.  No nausea, no vomiting.  No diarrhea.  No constipation. Genitourinary: Negative for dysuria. Musculoskeletal: Negative for back pain. Skin: Negative for rash. Neurological: Negative for headaches, focal weakness or numbness.  10-point ROS otherwise negative.  ____________________________________________   PHYSICAL EXAM:  VITAL SIGNS: ED Triage Vitals    Enc Vitals Group     BP 08/03/19 2132 (!) 187/123     Pulse Rate 08/03/19 2132 (!) 104     Resp 08/03/19 2132 20     Temp 08/03/19 2132 98.4 F (36.9 C)     Temp Source 08/03/19 2132 Oral     SpO2 08/03/19 2132 97 %     Weight 08/03/19 2133 (!) 340 lb (154.2 kg)     Height 08/03/19 2133 6' (1.829 m)   Constitutional: Alert and oriented. Well appearing and in no acute distress. Eyes: Conjunctivae are normal.  Head: Atraumatic. Nose: No congestion/rhinnorhea. Mouth/Throat: Mucous membranes are moist.  Neck: No stridor. Cardiovascular: Tachycardia. Good peripheral circulation. Grossly normal heart sounds.   Respiratory: Normal respiratory effort.  No retractions. Lungs with end-expiratory wheezing (mild) and frequent bronchospastic cough.  Gastrointestinal: Soft and nontender. No distention.  Musculoskeletal: No gross deformities of extremities. Neurologic:  Normal speech and language. Skin:  Skin is warm, dry and intact. No rash noted.  ____________________________________________   PROCEDURES  Procedure(s) performed:   Procedures  None  ____________________________________________   INITIAL IMPRESSION / ASSESSMENT AND PLAN / ED COURSE  Pertinent labs & imaging results that were available during my care of the patient were reviewed by me and considered in my medical decision making (see chart for details).   Patient presents to the emergency department with bronchospastic type cough with mild wheezing.  No hypoxemia or chest pain.  Blood pressure is significantly elevated but patient has been  out of meds for the past 2 days.  He gets paid tonight and can refill his medicines and start tomorrow.  Also provided a good Rx card for prescription assistance.  His lung exam is symmetrical.  He did have Covid in the past 2 months and so will defer additional testing at this time.  I do not see an indication for chest x-ray given his clinical presentation and exam.     ____________________________________________  FINAL CLINICAL IMPRESSION(S) / ED DIAGNOSES  Final diagnoses:  Acute bronchitis, unspecified organism     MEDICATIONS GIVEN DURING THIS VISIT:  Medications  albuterol (VENTOLIN HFA) 108 (90 Base) MCG/ACT inhaler 2 puff (has no administration in time range)  dexamethasone (DECADRON) injection 10 mg (has no administration in time range)     NEW OUTPATIENT MEDICATIONS STARTED DURING THIS VISIT:  New Prescriptions   ALBUTEROL (VENTOLIN HFA) 108 (90 BASE) MCG/ACT INHALER    Inhale 2 puffs into the lungs every 6 (six) hours as needed for wheezing or shortness of breath.   BENZONATATE (TESSALON) 100 MG CAPSULE    Take 1 capsule (100 mg total) by mouth 3 (three) times daily as needed for cough.    Note:  This document was prepared using Dragon voice recognition software and may include unintentional dictation errors.  Nanda Quinton, MD, [facility]    Madysen Faircloth, [facility], MD 08/03/19 2150

## 2019-08-03 NOTE — Discharge Instructions (Signed)
You were seen in the emergency department today with cough symptoms.  I am refilling your inhaler and have given you a dose of steroid here.  He can take the [facility] as needed for coughing.  We discussed your elevated blood pressure here.  Please refill your blood pressure medicines as soon as you are able.  Contact the primary care doctor listed if you do not already have a PCP for an ASAP follow up appointment.  Return to the emergency department with any new or suddenly worsening symptoms

## 2019-08-03 NOTE — ED Triage Notes (Signed)
Pt c/o productive cough with clear sputum that started yesterday after working around [employer] dust. Pt states this happens frequently and he just needs an "inhaler". Pt denies other symptoms. Pt also has been out of B/P medicine for 2 days.

## 2019-08-06 ENCOUNTER — Other Ambulatory Visit: Payer: Self-pay

## 2019-08-06 ENCOUNTER — Encounter (HOSPITAL_BASED_OUTPATIENT_CLINIC_OR_DEPARTMENT_OTHER): Payer: Self-pay | Admitting: Emergency Medicine

## 2019-08-06 ENCOUNTER — Emergency Department (HOSPITAL_BASED_OUTPATIENT_CLINIC_OR_DEPARTMENT_OTHER)
Admission: EM | Admit: 2019-08-06 | Discharge: 2019-08-06 | Disposition: A | Discharge status: Home / Self Care | Payer: Medicaid Other | Attending: Emergency Medicine | Admitting: Emergency Medicine

## 2019-08-06 ENCOUNTER — Emergency Department (HOSPITAL_BASED_OUTPATIENT_CLINIC_OR_DEPARTMENT_OTHER): Payer: Medicaid Other

## 2019-08-06 DIAGNOSIS — R059 Cough, unspecified: Secondary | ICD-10-CM

## 2019-08-06 DIAGNOSIS — F17200 Nicotine dependence, unspecified, uncomplicated: Secondary | ICD-10-CM | POA: Insufficient documentation

## 2019-08-06 DIAGNOSIS — I10 Essential (primary) hypertension: Secondary | ICD-10-CM | POA: Insufficient documentation

## 2019-08-06 DIAGNOSIS — R05 Cough: Secondary | ICD-10-CM | POA: Insufficient documentation

## 2019-08-06 DIAGNOSIS — R062 Wheezing: Secondary | ICD-10-CM | POA: Insufficient documentation

## 2019-08-06 MED ORDER — PREDNISONE 20 MG PO TABS
40.0000 mg | ORAL_TABLET | Freq: Once | ORAL | Status: AC
  Administered 2019-08-06: 40 mg via ORAL
  Filled 2019-08-06: qty 2

## 2019-08-06 MED ORDER — PREDNISONE 20 MG PO TABS: 40.0000 mg | ORAL_TABLET | Freq: Every day | ORAL | 0 refills | Status: AC

## 2019-08-06 MED ORDER — DOXYCYCLINE HYCLATE 100 MG PO CAPS: 100.0000 mg | ORAL_CAPSULE | Freq: Two times a day (BID) | ORAL | 0 refills | Status: AC

## 2019-08-06 MED ORDER — FLUTICASONE PROPIONATE 50 MCG/ACT NA SUSP: 2.0000 | Freq: Every day | NASAL | 0 refills | Status: AC

## 2019-08-06 MED ORDER — DOXYCYCLINE HYCLATE 100 MG PO TABS
100.0000 mg | ORAL_TABLET | Freq: Once | ORAL | Status: AC
  Administered 2019-08-06: 100 mg via ORAL
  Filled 2019-08-06: qty 1

## 2019-08-06 NOTE — ED Provider Notes (Signed)
Emergency Department Provider Note   I have reviewed the triage vital signs and the nursing notes.   HISTORY  Chief Complaint Shortness of Breath   HPI Edward Mcneil is a 24 y.o. male returns to the ED with cough, congestion, and runny nose. He was seen recently in the ED with minimal improvement at home. Has had COVID in the last 2 months. No CP. No significant SOB. No sick contacts. No radiation of symptoms or modifying factors.   Past Medical History:  Diagnosis Date  . Hypertension    not compliant    There are no problems to display for this patient.   Past Surgical History:  Procedure Laterality Date  . ANTERIOR CRUCIATE LIGAMENT REPAIR      Allergies Patient has no known allergies.  No family history on file.  Social History Social History   Tobacco Use  . Smoking status: Current Every Day Smoker  . Smokeless tobacco: Never Used  Vaping Use  . Vaping Use: Never used  Substance Use Topics  . Alcohol use: Yes    Comment: occ  . Drug use: No    Review of Systems  Constitutional: No fever/chills Eyes: No visual changes. ENT: No sore throat. Positive congestion.  Cardiovascular: Denies chest pain. Respiratory: Denies shortness of breath. Frequent cough.  Gastrointestinal: No  nausea, no vomiting.  No diarrhea.  No constipation. Skin: Negative for rash. Neurological: Negative for headaches, focal weakness or numbness.  10-point ROS otherwise negative.  ____________________________________________   PHYSICAL EXAM:  VITAL SIGNS: ED Triage Vitals  Enc Vitals Group     BP 08/06/19 1957 (!) 189/122     Pulse Rate 08/06/19 1957 (!) 115     Resp 08/06/19 1957 20     Temp 08/06/19 1957 99 F (37.2 C)     Temp Source 08/06/19 1957 Oral     SpO2 08/06/19 1957 95 %     Weight 08/06/19 1954 (!) 339 lb 8.1 oz (154 kg)     Height 08/06/19 1954 6' (1.829 m)   Constitutional: Alert and oriented. Well appearing and in no acute distress. Eyes:  Conjunctivae are normal.  Head: Atraumatic. Nose: No congestion/rhinnorhea. Mouth/Throat: Mucous membranes are moist.  Neck: No stridor.   Cardiovascular: Normal rate, regular rhythm. Good peripheral circulation. Grossly normal heart sounds.   Respiratory: Normal respiratory effort.  No retractions. Lungs CTAB. Frequent cough.  Gastrointestinal: Soft and nontender. No distention.  Musculoskeletal: No lower extremity tenderness nor edema. No gross deformities of extremities. Neurologic:  Normal speech and language. No gross focal neurologic deficits are appreciated.  Skin:  Skin is warm, dry and intact. No rash noted.  ____________________________________________  [id number]  DG Chest Portable 1 View  Result Date: 08/06/2019 CLINICAL DATA:  Shortness of breath EXAM: PORTABLE CHEST 1 VIEW COMPARISON:  12/29/2018 FINDINGS: The heart size and mediastinal contours are within normal limits. Both lungs are clear. The visualized skeletal structures are unremarkable. IMPRESSION: Normal study. Electronically Signed   By: Rolm Baptise M.D.   On: 08/06/2019 22:08    ____________________________________________   PROCEDURES  Procedure(s) performed:   Procedures  none ____________________________________________   INITIAL IMPRESSION / ASSESSMENT AND PLAN / ED COURSE  Pertinent labs & imaging results that were available during my care of the patient were reviewed by me and considered in my medical decision making (see chart for details).   Patient with continued cough. Meds at prior ED visit not completely resolving symptoms. CXR with return visit here  negative for infiltrate. Plan for steroid at home with abx and flonase. Advised that cough with URI can last up to 4 weeks. Encouraged PCP f/u for continued HTN.   ____________________________________________  FINAL CLINICAL IMPRESSION(S) / ED DIAGNOSES  Final diagnoses:  Cough  Wheezing  Essential hypertension     MEDICATIONS GIVEN  DURING THIS VISIT:  Medications  doxycycline (VIBRA-TABS) tablet 100 mg (100 mg Oral Given 08/06/19 2244)  predniSONE (DELTASONE) tablet 40 mg (40 mg Oral Given 08/06/19 2244)     NEW OUTPATIENT MEDICATIONS STARTED DURING THIS VISIT:  Discharge Medication List as of 08/06/2019 10:37 PM    START taking these medications   Details  doxycycline (VIBRAMYCIN) 100 MG capsule Take 1 capsule (100 mg total) by mouth 2 (two) times daily for 7 days., Starting Sun 08/06/2019, Until Sun 08/13/2019, Normal    fluticasone (FLONASE) 50 MCG/ACT nasal spray Place 2 sprays into both nostrils daily for 7 days., Starting Sun 08/06/2019, Until Sun 08/13/2019, Normal    predniSONE (DELTASONE) 20 MG tablet Take 2 tablets (40 mg total) by mouth daily for 4 days., Starting Mon 08/07/2019, Until Fri 08/11/2019, Normal        Note:  This document was prepared using Dragon voice recognition software and may include unintentional dictation errors.  Alona Bene, MD, [facility]    Cindy Brindisi, Arlyss Repress, MD 08/08/19 2017

## 2019-08-06 NOTE — ED Triage Notes (Signed)
Patient presents with complaints of shortness of breath; states productive cough with yellow/green sputum; seen here 6/10 for same; using inhaler with some relief; patient talking in complete sentences; nad noted.

## 2019-08-06 NOTE — Discharge Instructions (Signed)
You were seen in the emergency room today with wheezing and cough.  I am starting you on steroid for the next few days along with an antibiotic.  I have also called in a nasal spray to help with nasal congestion symptoms.  Please follow closely with your primary care doctor regarding elevated blood pressure.  The steroid may make your blood pressure go higher initially but this should return to normal.  If not, you may need medications or further intervention.  Return to the emergency department immediately if you develop shortness of breath, chest pain, other new or suddenly worsening symptoms.

## 2019-08-06 NOTE — ED Notes (Signed)
Pharmacy and medications updated with patient. Pt requests printed script if one is issued

## 2019-08-06 NOTE — ED Notes (Signed)
Pt discharged to home. Discharge instructions have been discussed with patient and/or family members. Pt verbally acknowledges understanding d/c instructions, and endorses comprehension to checkout at registration before leaving.  °

## 2019-08-25 IMAGING — CR DG CHEST 2V
2 series · 2 of 2 positions shown · non-contrast
Comparison: Chest radiographs 05/16/2017 and earlier.

CLINICAL DATA: 21-year-old male with productive cough and body
aches for 3 days.

EXAM:
CHEST - 2 VIEW

[w chest pa]
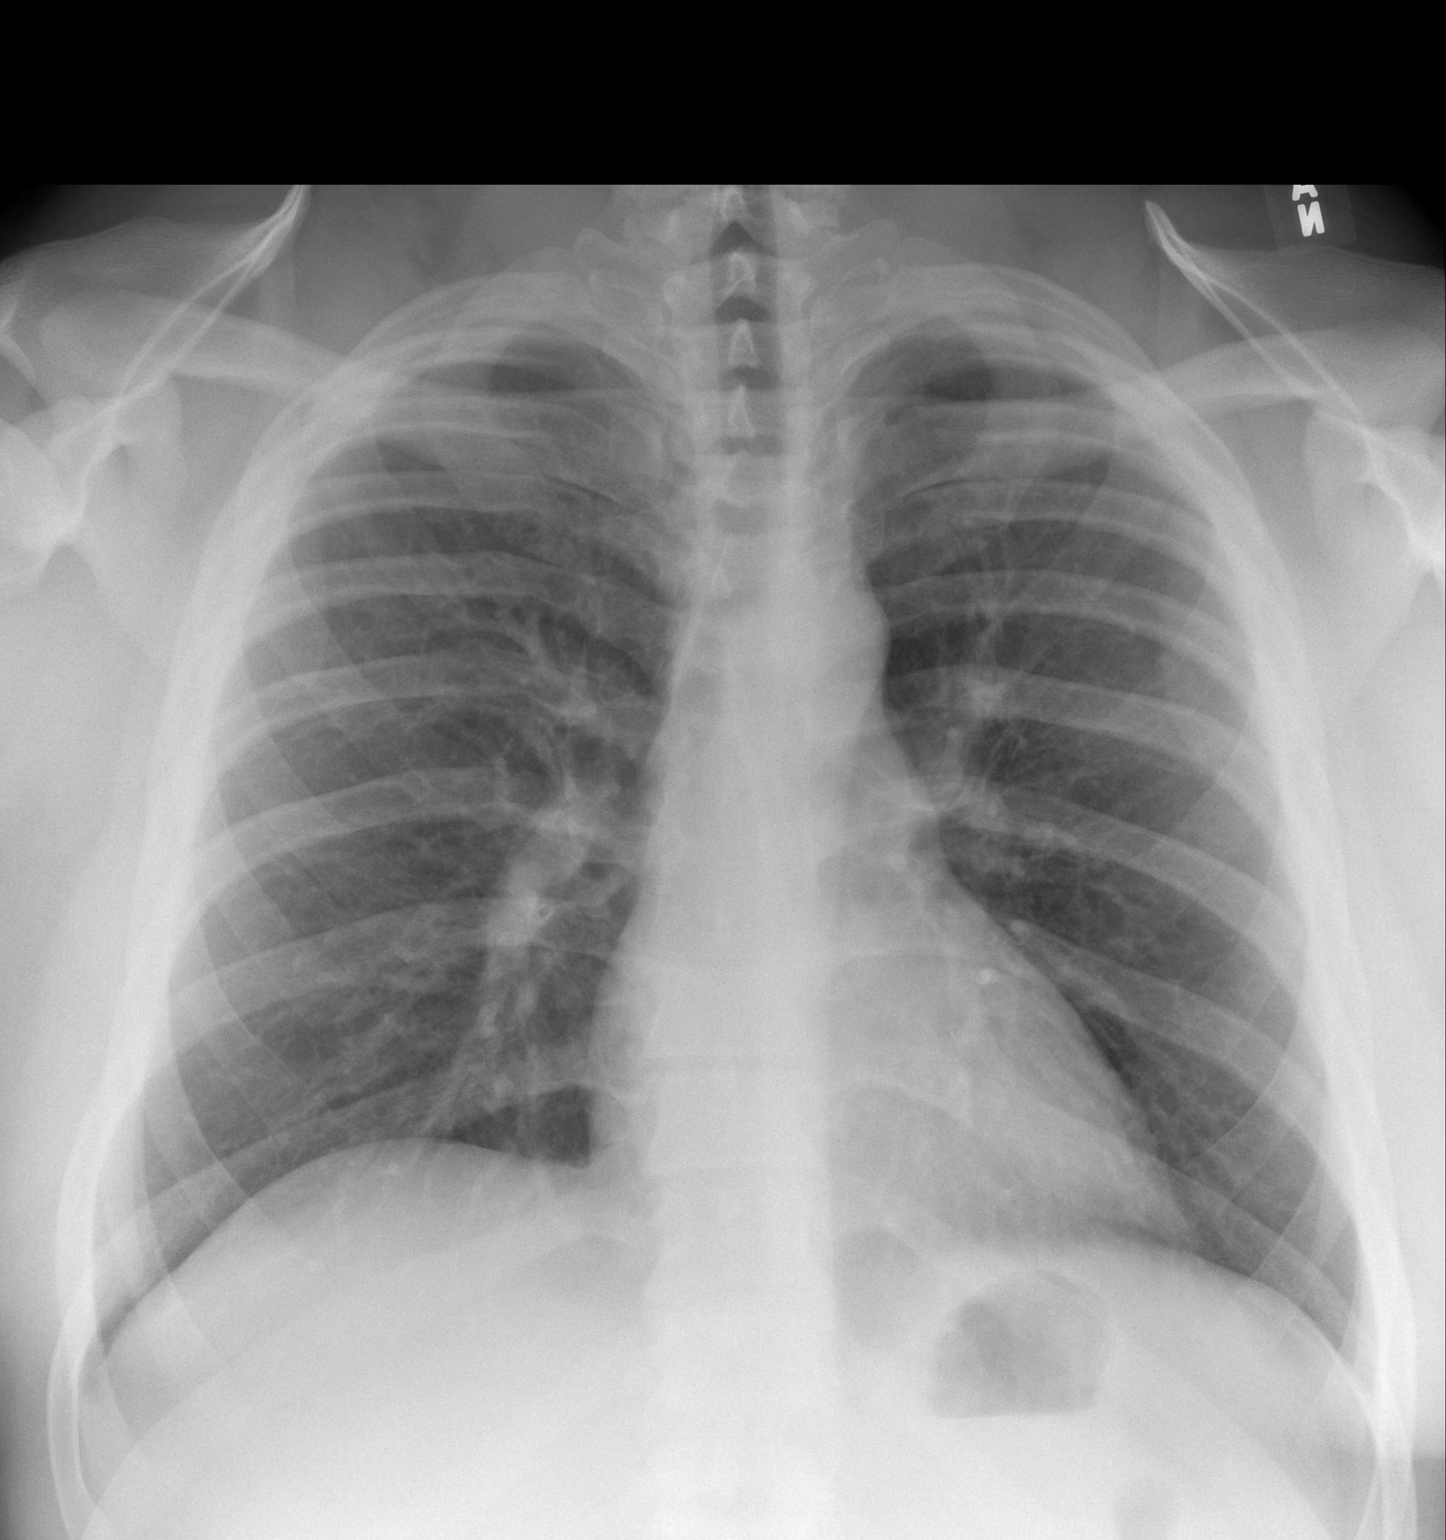

[w chest lat]
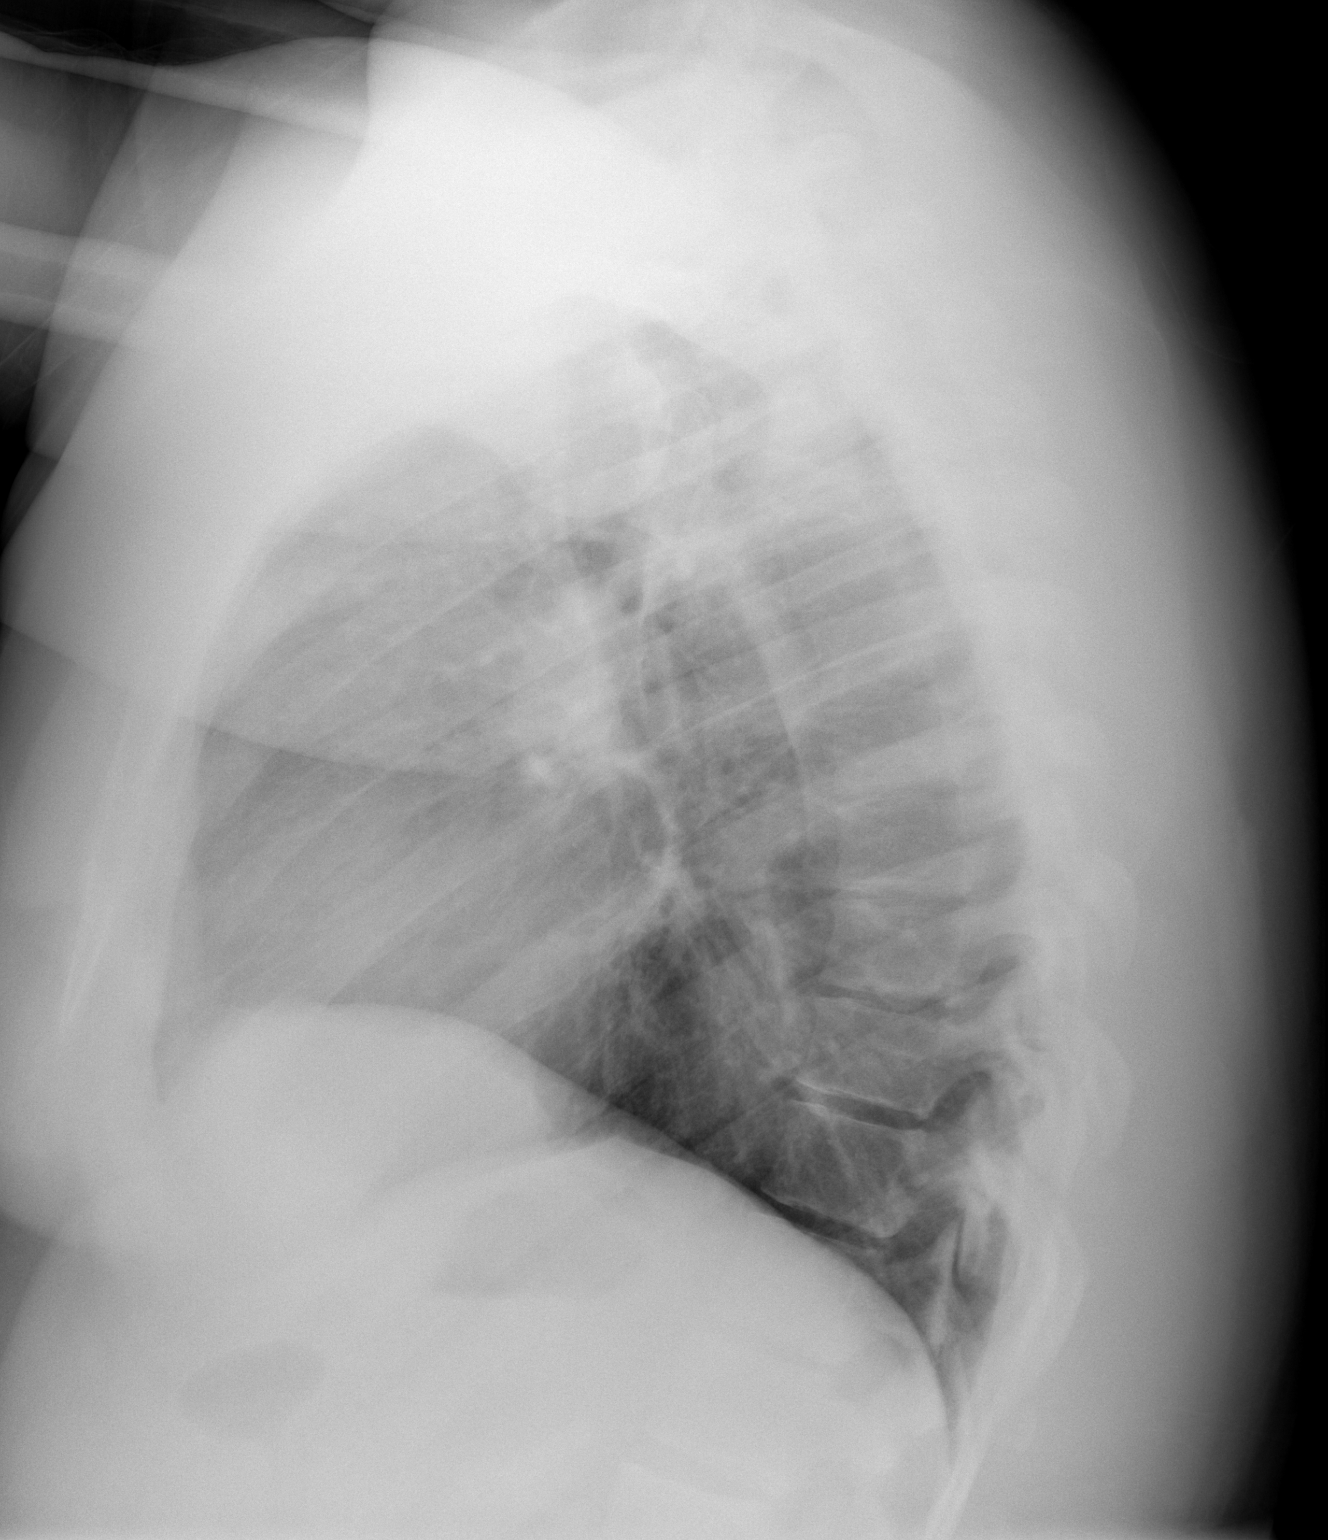

[2 of 2 positions shown; findings below may reference images not displayed]

FINDINGS: Lung volumes and mediastinal contours remain normal. Visualized
tracheal air column is within normal limits. The lungs appear stable
in clear. No pneumothorax or pleural effusion. No osseous
abnormality identified. Negative visible bowel gas pattern.
IMPRESSION: Negative.  No cardiopulmonary abnormality.

## 2020-10-09 ENCOUNTER — Emergency Department (HOSPITAL_BASED_OUTPATIENT_CLINIC_OR_DEPARTMENT_OTHER)
Admission: EM | Admit: 2020-10-09 | Discharge: 2020-10-09 | Disposition: A | Discharge status: Home / Self Care | Payer: Self-pay | Attending: Emergency Medicine | Admitting: Emergency Medicine

## 2020-10-09 ENCOUNTER — Emergency Department (HOSPITAL_BASED_OUTPATIENT_CLINIC_OR_DEPARTMENT_OTHER): Payer: Self-pay

## 2020-10-09 ENCOUNTER — Other Ambulatory Visit: Payer: Self-pay

## 2020-10-09 ENCOUNTER — Encounter (HOSPITAL_BASED_OUTPATIENT_CLINIC_OR_DEPARTMENT_OTHER): Payer: Self-pay

## 2020-10-09 DIAGNOSIS — Z20822 Contact with and (suspected) exposure to covid-19: Secondary | ICD-10-CM | POA: Insufficient documentation

## 2020-10-09 DIAGNOSIS — R000 Tachycardia, unspecified: Secondary | ICD-10-CM | POA: Insufficient documentation

## 2020-10-09 DIAGNOSIS — R059 Cough, unspecified: Secondary | ICD-10-CM

## 2020-10-09 DIAGNOSIS — J40 Bronchitis, not specified as acute or chronic: Secondary | ICD-10-CM

## 2020-10-09 DIAGNOSIS — I10 Essential (primary) hypertension: Secondary | ICD-10-CM | POA: Insufficient documentation

## 2020-10-09 DIAGNOSIS — F17210 Nicotine dependence, cigarettes, uncomplicated: Secondary | ICD-10-CM | POA: Insufficient documentation

## 2020-10-09 DIAGNOSIS — Z79899 Other long term (current) drug therapy: Secondary | ICD-10-CM | POA: Insufficient documentation

## 2020-10-09 HISTORY — DX: Calculus of kidney: N20.0

## 2020-10-09 LAB — CBC WITH DIFFERENTIAL/PLATELET
Abs Immature Granulocytes: 0.04 10*3/uL (ref 0.00–0.07)
Basophils Absolute: 0.1 10*3/uL (ref 0.0–0.1)
Basophils Relative: 1 %
Eosinophils Absolute: 0.1 10*3/uL (ref 0.0–0.5)
Eosinophils Relative: 1 %
HCT: 43.7 % (ref 39.0–52.0)
Hemoglobin: 14.9 g/dL (ref 13.0–17.0)
Immature Granulocytes: 0 %
Lymphocytes Relative: 24 %
Lymphs Abs: 2.2 10*3/uL (ref 0.7–4.0)
MCH: 31.2 pg (ref 26.0–34.0)
MCHC: 34.1 g/dL (ref 30.0–36.0)
MCV: 91.6 fL (ref 80.0–100.0)
Monocytes Absolute: 0.8 10*3/uL (ref 0.1–1.0)
Monocytes Relative: 9 %
Neutro Abs: 6.2 10*3/uL (ref 1.7–7.7)
Neutrophils Relative %: 65 %
Platelets: 233 10*3/uL (ref 150–400)
RBC: 4.77 MIL/uL (ref 4.22–5.81)
RDW: 11.7 % (ref 11.5–15.5)
WBC: 9.5 10*3/uL (ref 4.0–10.5)
nRBC: 0 % (ref 0.0–0.2)

## 2020-10-09 LAB — RESP PANEL BY RT-PCR (FLU A&B, COVID) ARPGX2
Influenza A by PCR: NEGATIVE
Influenza B by PCR: NEGATIVE
SARS Coronavirus 2 by RT PCR: NEGATIVE

## 2020-10-09 MED ORDER — AZITHROMYCIN 250 MG PO TABS: 250.0000 mg | ORAL_TABLET | Freq: Every day | ORAL | 0 refills | Status: AC

## 2020-10-09 MED ORDER — BENZONATATE 100 MG PO CAPS: 100.0000 mg | ORAL_CAPSULE | Freq: Three times a day (TID) | ORAL | 0 refills | Status: AC | PRN

## 2020-10-09 MED ORDER — AZITHROMYCIN 250 MG PO TABS
500.0000 mg | ORAL_TABLET | Freq: Once | ORAL | Status: AC
  Administered 2020-10-09: 500 mg via ORAL
  Filled 2020-10-09: qty 2

## 2020-10-09 MED ORDER — ALBUTEROL SULFATE HFA 108 (90 BASE) MCG/ACT IN AERS: 2.0000 | INHALATION_SPRAY | Freq: Four times a day (QID) | RESPIRATORY_TRACT | 0 refills | Status: AC | PRN

## 2020-10-09 MED ORDER — BENZONATATE 100 MG PO CAPS
100.0000 mg | ORAL_CAPSULE | Freq: Once | ORAL | Status: AC
  Administered 2020-10-09: 100 mg via ORAL
  Filled 2020-10-09: qty 1

## 2020-10-09 NOTE — Discharge Instructions (Addendum)
Please refer to the attached instructions 

## 2020-10-09 NOTE — ED Triage Notes (Signed)
Pt c/o prod cough x 3 days-states was +covid 3 weeks ago-NAD-steady gait

## 2020-10-09 NOTE — ED Provider Notes (Signed)
[facility] EMERGENCY DEPARTMENT Provider Note   CSN: [id number] Arrival date & time: 10/09/20  1740     History Chief Complaint  Patient presents with   Cough    Edward Mcneil is a 25 y.o. male.  Patient presents to ED for evaluation of persistent productive cough for 3 days. Feels achy with occasional chills. Nasal congestion and runny nose. Covid positive three weeks ago. No abdominal pain, chest pain, nausea or vomiting.  The history is provided by the patient. No language interpreter was used.  Cough Cough characteristics:  Productive Sputum characteristics:  Bloody Severity:  Moderate Onset quality:  Gradual Duration:  3 days Timing:  Constant Progression:  Worsening Chronicity:  New Associated symptoms: chills, fever, myalgias and sinus congestion   Associated symptoms: no chest pain       Past Medical History:  Diagnosis Date   Hypertension    not compliant   Kidney stone     There are no problems to display for this patient.   Past Surgical History:  Procedure Laterality Date   ANTERIOR CRUCIATE LIGAMENT REPAIR         No family history on file.  Social History   Tobacco Use   Smoking status: Every Day    Types: Cigarettes   Smokeless tobacco: Current  Vaping Use   Vaping Use: Never used  Substance Use Topics   Alcohol use: Yes    Comment: occ   Drug use: No    Home Medications Prior to Admission medications   Medication Sig Start Date End Date Taking? Authorizing Provider  albuterol (VENTOLIN HFA) 108 (90 Base) MCG/ACT inhaler Inhale 2 puffs into the lungs every 6 (six) hours as needed for wheezing or shortness of breath. 08/03/19   Long, Arlyss Repress, MD  amLODipine (NORVASC) 10 MG tablet Take 1 tablet (10 mg total) by mouth daily. 11/08/17   Law, Waylan Boga, PA-C  benzonatate (TESSALON) 100 MG capsule Take 1 capsule (100 mg total) by mouth 3 (three) times daily as needed for cough. 08/03/19   Long, Arlyss Repress, MD  cetirizine  (ZYRTEC ALLERGY) 10 MG tablet Take 1 tablet (10 mg total) by mouth daily. 11/08/17   Law, Waylan Boga, PA-C  fluticasone (FLONASE) 50 MCG/ACT nasal spray Place 2 sprays into both nostrils daily for 7 days. 08/06/19 08/13/19  Long, Arlyss Repress, MD  ibuprofen (ADVIL,MOTRIN) 800 MG tablet Take 1 tablet (800 mg total) by mouth 3 (three) times daily. 07/25/14   Teressa Lower, NP  omeprazole (PRILOSEC) 40 MG capsule Take one capsule every morning at least 30 minutes before first dose of Carafate. 12/30/15   Molpus, John, MD  sucralfate (CARAFATE) 1 g tablet Take 1 tablet (1 g total) by mouth 4 (four) times daily -  with meals and at bedtime. 12/30/15   Molpus, Jonny Ruiz, MD    Allergies    Patient has no known allergies.  Review of Systems   Review of Systems  Constitutional:  Positive for chills and fever.  Respiratory:  Positive for cough.   Cardiovascular:  Negative for chest pain.  Musculoskeletal:  Positive for myalgias.  All other systems reviewed and are negative.  Physical Exam Updated Vital Signs BP (!) 175/103 (BP Location: Left Arm)   Pulse (!) 108   Temp (!) 100.5 F (38.1 C) (Oral)   Resp 20   Ht 6' (1.829 m)   Wt (!) 147.9 kg   SpO2 98%   BMI 44.21 kg/m   Physical  Exam Constitutional:      Appearance: Normal appearance.  HENT:     Head: Normocephalic.     Nose: Congestion present.  Eyes:     Conjunctiva/sclera: Conjunctivae normal.  Cardiovascular:     Rate and Rhythm: Tachycardia present.     Heart sounds: Normal heart sounds.  Pulmonary:     Effort: Pulmonary effort is normal.     Breath sounds: Rhonchi present.  Chest:     Chest wall: No tenderness.  Abdominal:     Palpations: Abdomen is soft.  Musculoskeletal:        General: Normal range of motion.     Cervical back: Normal range of motion.  Skin:    General: Skin is warm and dry.  Neurological:     Mental Status: He is alert and oriented to person, place, and time.  Psychiatric:        Mood and Affect: Mood  normal.        Behavior: Behavior normal.    ED Results / Procedures / Treatments   Labs (all labs ordered are listed, but only abnormal results are displayed) Labs Reviewed - No data to display  EKG None  Radiology DG Chest Portable 1 View  Result Date: 10/09/2020 CLINICAL DATA:  Cough and fever EXAM: PORTABLE CHEST 1 VIEW COMPARISON:  12/12/2019 FINDINGS: The heart size and mediastinal contours are within normal limits. Both lungs are clear. The visualized skeletal structures are unremarkable. IMPRESSION: No active disease. Electronically Signed   By: Jasmine Pang M.D.   On: 10/09/2020 18:23    Procedures Procedures   Medications Ordered in ED Medications  azithromycin (ZITHROMAX) tablet 500 mg (500 mg Oral Given 10/09/20 2123)  benzonatate (TESSALON) capsule 100 mg (100 mg Oral Given 10/09/20 2123)    ED Course  I have reviewed the triage vital signs and the nursing notes.  Pertinent labs & imaging results that were available during my care of the patient were reviewed by me and considered in my medical decision making (see chart for details).    MDM Rules/Calculators/A&P                           Pt symptoms consistent with URI. CXR negative for acute infiltrate. Negative covid and flu screen. Likely bronchitis. Pt will be discharged with symptomatic treatment.  Discussed return precautions.  Pt is hemodynamically stable & in NAD prior to discharge.  Final Clinical Impression(s) / ED Diagnoses Final diagnoses:  Cough  Bronchitis    Rx / DC Orders ED Discharge Orders          Ordered    albuterol (VENTOLIN HFA) 108 (90 Base) MCG/ACT inhaler  Every 6 hours PRN        10/09/20 2110    benzonatate (TESSALON) 100 MG capsule  3 times daily PRN        10/09/20 2110    azithromycin (ZITHROMAX) 250 MG tablet  Daily        10/09/20 2110             Felicie Morn, NP 10/09/20 2347    Tegeler, Canary Brim, MD 10/10/20 [phone]

## 2020-10-11 ENCOUNTER — Other Ambulatory Visit: Payer: Self-pay

## 2020-10-11 NOTE — ED Notes (Signed)
Called for triage again,  No answer.  Called twice before without answe

## 2020-10-11 NOTE — ED Notes (Signed)
Called for triage again without answer

## 2020-10-12 ENCOUNTER — Emergency Department (HOSPITAL_BASED_OUTPATIENT_CLINIC_OR_DEPARTMENT_OTHER)
Admission: EM | Admit: 2020-10-12 | Discharge: 2020-10-12 | Discharge status: Against Medical Advice | Payer: Medicaid Other

## 2020-10-12 NOTE — ED Notes (Signed)
Called pt again and went outside to call him, other staff members had checked outside previously

## 2021-04-27 ENCOUNTER — Emergency Department (HOSPITAL_BASED_OUTPATIENT_CLINIC_OR_DEPARTMENT_OTHER): Payer: Self-pay

## 2021-04-27 ENCOUNTER — Encounter (HOSPITAL_BASED_OUTPATIENT_CLINIC_OR_DEPARTMENT_OTHER): Payer: Self-pay | Admitting: Emergency Medicine

## 2021-04-27 ENCOUNTER — Other Ambulatory Visit: Payer: Self-pay

## 2021-04-27 ENCOUNTER — Emergency Department (HOSPITAL_BASED_OUTPATIENT_CLINIC_OR_DEPARTMENT_OTHER)
Admission: EM | Admit: 2021-04-27 | Discharge: 2021-04-27 | Disposition: A | Discharge status: Home / Self Care | Payer: Self-pay | Attending: Emergency Medicine | Admitting: Emergency Medicine

## 2021-04-27 DIAGNOSIS — R1030 Lower abdominal pain, unspecified: Secondary | ICD-10-CM

## 2021-04-27 DIAGNOSIS — R1032 Left lower quadrant pain: Secondary | ICD-10-CM | POA: Insufficient documentation

## 2021-04-27 DIAGNOSIS — I10 Essential (primary) hypertension: Secondary | ICD-10-CM | POA: Insufficient documentation

## 2021-04-27 DIAGNOSIS — Z79899 Other long term (current) drug therapy: Secondary | ICD-10-CM | POA: Insufficient documentation

## 2021-04-27 DIAGNOSIS — R112 Nausea with vomiting, unspecified: Secondary | ICD-10-CM | POA: Insufficient documentation

## 2021-04-27 LAB — URINALYSIS, ROUTINE W REFLEX MICROSCOPIC
Bilirubin Urine: NEGATIVE
Glucose, UA: NEGATIVE mg/dL
Hgb urine dipstick: NEGATIVE
Ketones, ur: NEGATIVE mg/dL
Leukocytes,Ua: NEGATIVE
Nitrite: NEGATIVE
Protein, ur: NEGATIVE mg/dL
Specific Gravity, Urine: 1.025 (ref 1.005–1.030)
pH: 6.5 (ref 5.0–8.0)

## 2021-04-27 LAB — COMPREHENSIVE METABOLIC PANEL
ALT: 31 U/L (ref 0–44)
AST: 22 U/L (ref 15–41)
Albumin: 4.4 g/dL (ref 3.5–5.0)
Alkaline Phosphatase: 85 U/L (ref 38–126)
Anion gap: 9 (ref 5–15)
BUN: 14 mg/dL (ref 6–20)
CO2: 24 mmol/L (ref 22–32)
Calcium: 9.2 mg/dL (ref 8.9–10.3)
Chloride: 103 mmol/L (ref 98–111)
Creatinine, Ser: 0.95 mg/dL (ref 0.61–1.24)
GFR, Estimated: >60 mL/min (ref >60)
Glucose, Bld: 98 mg/dL (ref 70–99)
Potassium: 4.1 mmol/L (ref 3.5–5.1)
Sodium: 136 mmol/L (ref 135–145)
Total Bilirubin: 1.1 mg/dL (ref 0.3–1.2)
Total Protein: 7.9 g/dL (ref 6.5–8.1)

## 2021-04-27 LAB — CBC
HCT: 47.1 % (ref 39.0–52.0)
Hemoglobin: 16 g/dL (ref 13.0–17.0)
MCH: 31.3 pg (ref 26.0–34.0)
MCHC: 34 g/dL (ref 30.0–36.0)
MCV: 92.2 fL (ref 80.0–100.0)
Platelets: 240 10*3/uL (ref 150–400)
RBC: 5.11 MIL/uL (ref 4.22–5.81)
RDW: 11.6 % (ref 11.5–15.5)
WBC: 9.9 10*3/uL (ref 4.0–10.5)
nRBC: 0 % (ref 0.0–0.2)

## 2021-04-27 LAB — LIPASE, BLOOD: Lipase: 31 U/L (ref 11–51)

## 2021-04-27 MED ORDER — ONDANSETRON HCL 4 MG/2ML IJ SOLN
4.0000 mg | Freq: Once | INTRAMUSCULAR | Status: AC
  Administered 2021-04-27: 4 mg via INTRAVENOUS
  Filled 2021-04-27: qty 2

## 2021-04-27 MED ORDER — MORPHINE SULFATE (PF) 4 MG/ML IV SOLN
4.0000 mg | Freq: Once | INTRAVENOUS | Status: AC
  Administered 2021-04-27: 4 mg via INTRAVENOUS
  Filled 2021-04-27: qty 1

## 2021-04-27 MED ORDER — LACTATED RINGERS IV BOLUS
1000.0000 mL | Freq: Once | INTRAVENOUS | Status: AC
  Administered 2021-04-27: 1000 mL via INTRAVENOUS

## 2021-04-27 NOTE — ED Notes (Signed)
ED Provider at bedside. 

## 2021-04-27 NOTE — ED Triage Notes (Signed)
Pt reports kids had stomach bug and he has similar symptoms. Pt has not taken his blood pressure medication for 2 days due to vomiting. Pt c/o abdominal pain denies diarrhea. ?

## 2021-04-27 NOTE — ED Notes (Signed)
Ginger Ale provided for po challenge 

## 2021-04-27 NOTE — ED Provider Notes (Cosign Needed)
[facility] EMERGENCY DEPARTMENT Provider Note   CSN: [id number] Arrival date & time: 04/27/21  1727     History Chief Complaint  Patient presents with   Emesis    Edward Mcneil is a 26 y.o. male with history of hypertension and kidney stones presents the emergency department for ration of vomiting since last night.  He reports his children recently had the same symptoms.  He reports some nausea but denies any diarrhea, hematemesis, coffee-ground emesis, chest pain, shortness of breath, or fevers.  Denies any URI symptoms.  Patient has some suprapubic abdominal pain as well as bilateral flank pain.  He reports it "felt like when he had my kidney stone".  Daily medications related lidocaine and lisinopril.  No known drug allergies.  Smokes 2 packs/day.  Endorses occasional alcohol use.  Denies any drug use.   Emesis Associated symptoms: abdominal pain   Associated symptoms: no chills, no diarrhea, no fever, no headaches and no sore throat       Home Medications Prior to Admission medications   Medication Sig Start Date End Date Taking? Authorizing Provider  albuterol (VENTOLIN HFA) 108 (90 Base) MCG/ACT inhaler Inhale 2 puffs into the lungs every 6 (six) hours as needed for wheezing or shortness of breath. 10/09/20   Felicie Morn, NP  amLODipine (NORVASC) 10 MG tablet Take 1 tablet (10 mg total) by mouth daily. 11/08/17   Law, Waylan Boga, PA-C  azithromycin (ZITHROMAX) 250 MG tablet Take 1 tablet (250 mg total) by mouth daily. Take one tablet every day every day until finished. 10/09/20   Felicie Morn, NP  benzonatate (TESSALON) 100 MG capsule Take 1 capsule (100 mg total) by mouth 3 (three) times daily as needed for cough. 10/09/20   Felicie Morn, NP  cetirizine (ZYRTEC ALLERGY) 10 MG tablet Take 1 tablet (10 mg total) by mouth daily. 11/08/17   Law, Waylan Boga, PA-C  fluticasone (FLONASE) 50 MCG/ACT nasal spray Place 2 sprays into both nostrils daily for 7 days. 08/06/19  08/13/19  Long, Arlyss Repress, MD  ibuprofen (ADVIL,MOTRIN) 800 MG tablet Take 1 tablet (800 mg total) by mouth 3 (three) times daily. 07/25/14   Teressa Lower, NP  omeprazole (PRILOSEC) 40 MG capsule Take one capsule every morning at least 30 minutes before first dose of Carafate. 12/30/15   Molpus, John, MD  sucralfate (CARAFATE) 1 g tablet Take 1 tablet (1 g total) by mouth 4 (four) times daily -  with meals and at bedtime. 12/30/15   Molpus, Jonny Ruiz, MD      Allergies    Patient has no known allergies.    Review of Systems   Review of Systems  Constitutional:  Negative for chills and fever.  HENT:  Negative for congestion, rhinorrhea and sore throat.   Respiratory:  Negative for shortness of breath.   Cardiovascular:  Negative for chest pain.  Gastrointestinal:  Positive for abdominal pain, nausea and vomiting. Negative for constipation and diarrhea.  Genitourinary:  Positive for flank pain. Negative for dysuria, frequency, hematuria and urgency.  Neurological:  Negative for light-headedness and headaches.   Physical Exam Updated Vital Signs BP (!) 148/91    Pulse 91    Temp 98.1 F (36.7 C) (Oral)    Resp 18    Ht 6' (1.829 m)    Wt (!) 145.2 kg    SpO2 99%    BMI 43.40 kg/m  Physical Exam Vitals and nursing note reviewed.  Constitutional:  General: He is not in acute distress.    Appearance: Normal appearance. He is not toxic-appearing.  HENT:     Head: Normocephalic and atraumatic.     Nose: Nose normal.     Mouth/Throat:     Mouth: Mucous membranes are moist.     Pharynx: No oropharyngeal exudate or posterior oropharyngeal erythema.  Eyes:     General: No scleral icterus. Cardiovascular:     Rate and Rhythm: Normal rate and regular rhythm.  Pulmonary:     Effort: Pulmonary effort is normal.     Breath sounds: Normal breath sounds.  Abdominal:     General: Bowel sounds are normal.     Palpations: Abdomen is soft.     Tenderness: There is abdominal tenderness. There is  no right CVA tenderness, left CVA tenderness, guarding or rebound.     Comments: Abdominal exam limited secondary to body habitus.  Abdomen soft with mild tenderness over the suprapubic area.  No guarding or rebound noted.  Normal active bowel sounds noted.  Abdomen is soft.  No CVA tenderness.  His bilateral flank pain is lower just superior to the iliac crest bilaterally.  No overlying erythema or palpable warmth.  No fluctuance or induration no overlying skin changes noted.  Musculoskeletal:        General: No deformity.     Cervical back: Normal range of motion.  Skin:    General: Skin is warm and dry.  Neurological:     General: No focal deficit present.     Mental Status: He is alert. Mental status is at baseline.    ED Results / Procedures / Treatments   Labs (all labs ordered are listed, but only abnormal results are displayed) Labs Reviewed  LIPASE, BLOOD  COMPREHENSIVE METABOLIC PANEL  CBC  URINALYSIS, ROUTINE W REFLEX MICROSCOPIC    EKG None  Radiology CT Renal Stone Study  Result Date: 04/27/2021 CLINICAL DATA:  Abdominal pain and flank pain. EXAM: CT ABDOMEN AND PELVIS WITHOUT CONTRAST TECHNIQUE: Multidetector CT imaging of the abdomen and pelvis was performed following the standard protocol without IV contrast. RADIATION DOSE REDUCTION: This exam was performed according to the departmental dose-optimization program which includes automated exposure control, adjustment of the mA and/or kV according to patient size and/or use of iterative reconstruction technique. COMPARISON:  CT abdomen and pelvis 06/19/2020. CT abdomen and pelvis 10/12/2019. FINDINGS: Lower chest: No acute abnormality. Hepatobiliary: There are 2 stable mildly hyperdense lesions in the liver. One is in the right lobe measuring 2.1 cm image 2/15 in the other is in the left lobe measuring 2.7 cm image 2/24. No new liver lesions are identified. No gallstones or biliary ductal dilatation. Pancreas: Unremarkable.  No pancreatic ductal dilatation or surrounding inflammatory changes. Spleen: Normal in size without focal abnormality. Adrenals/Urinary Tract: Adrenal glands are unremarkable. Kidneys are normal, without renal calculi, focal lesion, or hydronephrosis. Bladder is unremarkable. Stomach/Bowel: Stomach is within normal limits. Appendix appears normal. No evidence of bowel wall thickening, distention, or inflammatory changes. Vascular/Lymphatic: No significant vascular findings are present. No enlarged abdominal or pelvic lymph nodes. Reproductive: Prostate is unremarkable. Other: There is a small fat containing umbilical hernia. There is no free fluid or free air. Musculoskeletal: No acute or significant osseous findings. IMPRESSION: 1. No acute localizing process in the abdomen or pelvis. 2. There are 2 stable mildly hyperdense lesions in the liver, indeterminate. These can be further characterized with MRI non emergently. Electronically Signed   By: Darliss Cheney  M.D.   On: 04/27/2021 21:11    Procedures Procedures   Medications Ordered in ED Medications  lactated ringers bolus 1,000 mL ( Intravenous Stopped 04/27/21 2154)  ondansetron (ZOFRAN) injection 4 mg (4 mg Intravenous Given 04/27/21 2044)  morphine (PF) 4 MG/ML injection 4 mg (4 mg Intravenous Given 04/27/21 2045)    ED Course/ Medical Decision Making/ A&P                           Medical Decision Making Amount and/or Complexity of Data Reviewed Labs: ordered. Radiology: ordered.  Risk Prescription drug management.   26 year old male presents emergency department evaluation of nausea and vomiting and abdominal pain with bilateral flank pain.  Differential diagnosis includes was not limited to electrolyte abnormality, renal stone, viral illness, UTI, appendicitis, cholecystitis.  Vital signs show mild hypertension otherwise afebrile, normal pulse rate, satting well room air with no increased work of breathing.  Physical exam is pertinent for  abdominal exam limited secondary to body habitus.  Abdomen soft with mild tenderness over the suprapubic area.  No guarding or rebound noted.  Normal active bowel sounds noted.  Abdomen is soft.  No CVA tenderness.  His bilateral flank pain is lower just superior to the iliac crest bilaterally.  No overlying erythema or palpable warmth.  No fluctuance or induration no overlying skin changes noted.  Moist mucous membranes.  This patient mostly has a viral illness that he obtained from his children who had the same vomiting symptoms.  In the setting of history of kidney stones and that this pain is reported to be like his kidney stones, will order CT renal.  Lab work ordered as well.  The patient received 1 L of lactated Ringer's as well as some morphine and Zofran for symptom control.  I independently reviewed and interpreted the patient's labs and imaging and agree with radiologist finding.  Overall, lab work is unremarkable.  CMP shows no electrolyte abnormality.  Normal LFTs.  Lipase is normal.  CBC shows no leukocytosis or anemia.  Urine shows normal concentration with no nitrates leukocytes or ketones.  Patient appears well-hydrated.  CT renal stone studies shows No acute localizing process in the abdomen or pelvis.There are 2 stable mildly hyperdense lesions in the liver, indeterminate. These can be further characterized with MRI non emergently.  At this time, I doubt any electrolyte abnormality given the normal lab work.  Doubt any cholecystitis or renal stone given normal imaging and also patient does not have any pain in the right upper quadrant.  I likely think his bilateral flank pain is from vomiting or from his job.  Doubt appendicitis as patient does not have a positive Rovsing's or right lower quadrant tenderness.  Patient reports his abdominal pain is very mild and its mainly the vomiting that he is concerned with.  P.o. challenge passed.  The patient has not had any emesis in the emergency  department and reports he is feeling better after the fluids.  I discussed with him the lab and imaging findings and recommended to follow-up with a PCP for these mildly hyperdense lesions in the liver outpatient.  Return precautions discussed.  Patient agrees with plan.  Patient is stable and being discharged home in good condition.  Final Clinical Impression(s) / ED Diagnoses Final diagnoses:  Nausea and vomiting, unspecified vomiting type  Lower abdominal pain    Rx / DC Orders ED Discharge Orders     None  Achille RichRansom, Narjis Mira, PA-C 05/02/21 1127

## 2021-04-27 NOTE — Discharge Instructions (Addendum)
You were seen here today for evaluation of your abdominal pain and nausea/vomiting. Your lab work was normal. Your CT scan showed that there are 2 stable mildly hyperdense lesions in the liver, indeterminate. These can be further characterized with MRI non emergently. Please discuss this with your primary care provider for further workup and surveillance of this. If you do not have a PCP, one has been attached to the chart. Please call to schedule an appointment. I am prescribing you Zofran to take as needed for your nausea vomiting. I have also included a work note. Please make sure to stay well-hydrated with plenty of fluids, mainly water. Please obtain plenty of rest.  ? ? ?Contact a doctor if: ?Your symptoms get worse. ?You have new symptoms. ?You have a fever. ?You cannot drink fluids without vomiting. ?You feel like you may vomit for more than 2 days. ?You feel light-headed or dizzy. ?You have a headache. ?You have muscle cramps. ?You have a rash. ?You have pain while peeing. ?Get help right away if: ?You have pain in your chest, neck, arm, or jaw. ?You feel very weak or you faint. ?You vomit again and again. ?You have vomit that is bright red or looks like black coffee grounds. ?You have bloody or black poop (stools) or poop that looks like tar. ?You have a very bad headache, a stiff neck, or both. ?You have very bad pain, cramping, or bloating in your belly (abdomen). ?You have trouble breathing. ?You are breathing very quickly. ?Your heart is beating very quickly. ?Your skin feels cold and clammy. ?You feel confused. ?You have signs of losing too much water in your body, such as: ?Dark pee, very little pee, or no pee. ?Cracked lips. ?Dry mouth. ?Sunken eyes. ?Sleepiness. ?Weakness. ?These symptoms may be an emergency. Get help right away. Call 911. ?Do not wait to see if the symptoms will go away. ?Do not drive yourself to the hospital. ?

## 2021-04-27 NOTE — ED Notes (Signed)
Pt tolerating ginger ale 

## 2021-05-22 IMAGING — DX DG CHEST 1V PORT
1 series · 1 of 1 positions shown · non-contrast
Comparison: 12/29/2018

CLINICAL DATA: Shortness of breath

EXAM:
PORTABLE CHEST 1 VIEW

[chest ap]
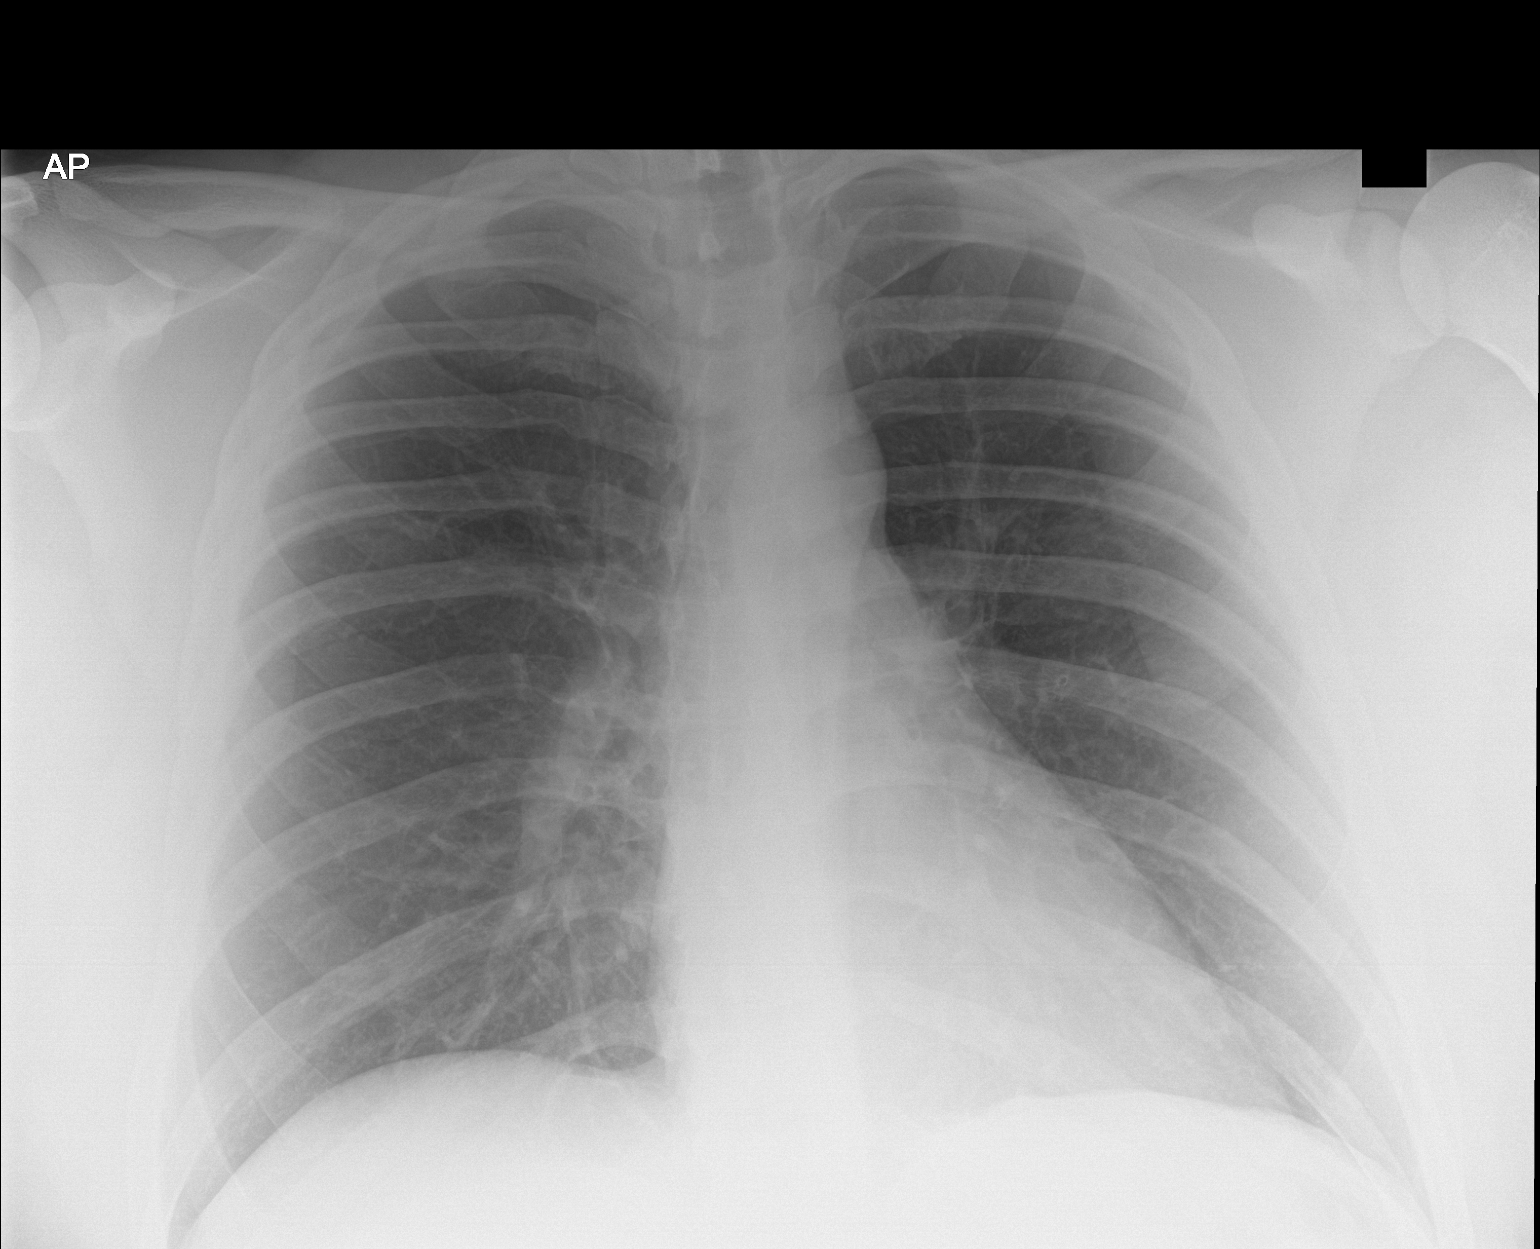

[1 of 1 positions shown; findings below may reference images not displayed]

FINDINGS: The heart size and mediastinal contours are within normal limits.
Both lungs are clear. The visualized skeletal structures are
unremarkable.
IMPRESSION: Normal study.

## 2021-12-12 ENCOUNTER — Other Ambulatory Visit: Payer: Self-pay | Admitting: Nurse Practitioner

## 2021-12-12 ENCOUNTER — Ambulatory Visit (INDEPENDENT_AMBULATORY_CARE_PROVIDER_SITE_OTHER): Payer: Self-pay

## 2021-12-12 DIAGNOSIS — Z021 Encounter for pre-employment examination: Secondary | ICD-10-CM

## 2022-11-11 ENCOUNTER — Encounter (HOSPITAL_BASED_OUTPATIENT_CLINIC_OR_DEPARTMENT_OTHER): Payer: Self-pay | Admitting: Emergency Medicine

## 2022-11-11 ENCOUNTER — Other Ambulatory Visit: Payer: Self-pay

## 2022-11-11 ENCOUNTER — Other Ambulatory Visit (HOSPITAL_BASED_OUTPATIENT_CLINIC_OR_DEPARTMENT_OTHER): Payer: Self-pay

## 2022-11-11 ENCOUNTER — Emergency Department (HOSPITAL_BASED_OUTPATIENT_CLINIC_OR_DEPARTMENT_OTHER)
Admission: EM | Admit: 2022-11-11 | Discharge: 2022-11-11 | Disposition: A | Discharge status: Home / Self Care | Payer: No Typology Code available for payment source | Attending: Emergency Medicine | Admitting: Emergency Medicine

## 2022-11-11 DIAGNOSIS — I10 Essential (primary) hypertension: Secondary | ICD-10-CM | POA: Diagnosis not present

## 2022-11-11 DIAGNOSIS — M25571 Pain in right ankle and joints of right foot: Secondary | ICD-10-CM | POA: Diagnosis not present

## 2022-11-11 DIAGNOSIS — Z79899 Other long term (current) drug therapy: Secondary | ICD-10-CM | POA: Insufficient documentation

## 2022-11-11 DIAGNOSIS — M79671 Pain in right foot: Secondary | ICD-10-CM | POA: Diagnosis present

## 2022-11-11 MED ORDER — AMLODIPINE BESYLATE 5 MG PO TABS
10.0000 mg | ORAL_TABLET | Freq: Once | ORAL | Status: AC
  Administered 2022-11-11: 10 mg via ORAL
  Filled 2022-11-11: qty 2

## 2022-11-11 MED ORDER — AMLODIPINE BESYLATE 10 MG PO TABS
10.0000 mg | ORAL_TABLET | Freq: Every day | ORAL | 2 refills | Status: AC
  Filled 2022-11-11: qty 30, 30d supply, fill #0
  Filled 2023-03-19: qty 30, 30d supply, fill #1

## 2022-11-11 NOTE — Discharge Instructions (Addendum)
You were seen in the emerged from today for evaluation of your ankle pain.  I do not think this needs an emergent MRI.  I have referred you to an orthopedic surgeon that you could follow-up with who specializes in foot and ankle.  Please make sure you call them to schedule an appointment.  Additionally, I have sent you in refills of your blood pressure medication.  Please make sure you take this blood pressure medication as prescribed.  I have also included a form on taking her blood pressure to make sure you are recording it before your next visit with your PCP as you may need to have your blood pressure medication adjusted.  If you have any concerns, new or worsening symptoms, please return to your nearest Emergency Department for reevaluation.  Contact a health care provider if: Your pain gets worse. Your pain does not get better with medicine. You have a fever or chills. You have more trouble walking. You have new symptoms. Your foot, leg, toes, or ankle tingles, becomes numb or swollen, or turns cold and blue.

## 2022-11-11 NOTE — ED Provider Notes (Signed)
[facility] Provider Note   CSN: [id number] Arrival date & time: 11/11/22  1104     History Chief Complaint  Patient presents with   Ankle Pain    Edward Mcneil is a 27 y.o. male with h/o HTN presents to the ER for evaluation of needing MRI. The patient reports that he had a fall around 3 weeks ago where he stepped in a hole and inverted his ankle. He has been following up with [facility]. He obtained an order for an MRI during his last visit and was told to come to the [facility] imaging center to get this done.  Unfortunately, he was checked into the emergency department.  Additionally, his blood pressure has been high but he is been out of his blood pressure medications for the past 2 to 3 months.  He does have a follow-up appointment with a primary care doctor within the next upcoming weeks.  He does not have any chest pain, headaches, or visual changes.  No weakness or trouble walking or trouble talking.  He is not having numbness or tingling into the foot is just still having pain.  He was in a boot but then they recommended that he be in an Ace bandage.  He has not followed up with orthopedics.   Ankle Pain      Home Medications Prior to Admission medications   Medication Sig Start Date End Date Taking? Authorizing Provider  albuterol (VENTOLIN HFA) 108 (90 Base) MCG/ACT inhaler Inhale 2 puffs into the lungs every 6 (six) hours as needed for wheezing or shortness of breath. 10/09/20   Felicie Morn, NP  amLODipine (NORVASC) 10 MG tablet Take 1 tablet (10 mg total) by mouth daily. 11/11/22   Achille Rich, PA-C  azithromycin (ZITHROMAX) 250 MG tablet Take 1 tablet (250 mg total) by mouth daily. Take one tablet every day every day until finished. 10/09/20   Felicie Morn, NP  benzonatate (TESSALON) 100 MG capsule Take 1 capsule (100 mg total) by mouth 3 (three) times daily as needed for cough. 10/09/20   Felicie Morn, NP   cetirizine (ZYRTEC ALLERGY) 10 MG tablet Take 1 tablet (10 mg total) by mouth daily. 11/08/17   Law, Waylan Boga, PA-C  fluticasone (FLONASE) 50 MCG/ACT nasal spray Place 2 sprays into both nostrils daily for 7 days. 08/06/19 08/13/19  Long, Arlyss Repress, MD  ibuprofen (ADVIL,MOTRIN) 800 MG tablet Take 1 tablet (800 mg total) by mouth 3 (three) times daily. 07/25/14   Teressa Lower, NP  omeprazole (PRILOSEC) 40 MG capsule Take one capsule every morning at least 30 minutes before first dose of Carafate. 12/30/15   Molpus, John, MD  sucralfate (CARAFATE) 1 g tablet Take 1 tablet (1 g total) by mouth 4 (four) times daily -  with meals and at bedtime. 12/30/15   Molpus, Jonny Ruiz, MD      Allergies    Patient has no known allergies.    Review of Systems   Review of Systems  Respiratory:  Negative for shortness of breath.   Cardiovascular:  Negative for chest pain.  Gastrointestinal:  Negative for abdominal pain, diarrhea, nausea and vomiting.  Musculoskeletal:  Positive for arthralgias.  Neurological:  Negative for dizziness, weakness, light-headedness and headaches.    Physical Exam Updated Vital Signs BP (!) 180/99   Pulse 90   Temp 98 F (36.7 C)   Resp 20   Ht 6' (1.829 m)   [place] Fordyce Marland Kitchen)  149.7 kg   SpO2 95%   BMI 44.76 kg/m  Physical Exam Vitals and nursing note reviewed.  Constitutional:      General: He is not in acute distress.    Appearance: He is not ill-appearing or toxic-appearing.  Eyes:     General: No scleral icterus. Pulmonary:     Effort: Pulmonary effort is normal. No respiratory distress.  Musculoskeletal:        General: Tenderness present.     Comments: Some mild tenderness to the lateral malleoli of the right ankle.  He is neuro vastly intact distally.  Compartments are soft.  Sensations intact.  Palpable DP and PT pulses.  Brisk refill present on the toes.  He still been to flex and extend his ankle as well as rotate it but just has pain with doing so.  He is ambulatory.   I do not appreciate any significant instability.  Skin:    General: Skin is warm and dry.  Neurological:     General: No focal deficit present.     Mental Status: He is alert. Mental status is at baseline.  Psychiatric:        Mood and Affect: Mood normal.     ED Results / Procedures / Treatments   Labs (all labs ordered are listed, but only abnormal results are displayed) Labs Reviewed - No data to display  EKG None  Radiology No results found.  Procedures Procedures   Medications Ordered in ED Medications  amLODipine (NORVASC) tablet 10 mg (10 mg Oral Given 11/11/22 1336)    ED Course/ Medical Decision Making/ A&P   Medical Decision Making Risk Prescription drug management.   27 y.o. male presents to the ER today for evaluation of right ankle pain and MRI order. Differential diagnosis includes but is not limited to sprain, strain, . Vital signs show elevated BP, otherwise unremarkable. Physical exam as noted above.   Patient has not had any new injury to his foot.  He has already had x-rays done at urgent care that were normal per the information listed on the MRI order.  He has not had any increase in pain.  He comes in with an Ace bandage wrap.  I recommended that he actually put back on his boot before he is seen by orthopedics.  I will provide him with an orthopedics referral given that he was not given what previously.  I discussed with the patient that I do not see an emergent need to order an MRI given that he is neurovascular intact and does not have any significant weakness in his foot and that this problem has been going on for weeks.  He verbalizes understanding to this.  However, given that his blood pressure is elevated I will send him in some refills of his amlodipine 10 mg that he has been out of.  He is not having any chest pain, visual changes, or shortness of breath.  I do not see any lab work needed at this time.  He was given his dose of the amlodipine  while here as well.  He is stable for discharge home with close PCP and Ortho follow-up.  We discussed plan at bedside. We discussed strict return precautions and red flag symptoms. The patient verbalized their understanding and agrees to the plan. The patient is stable and being discharged home in good condition.  Portions of this report may have been transcribed using voice recognition software. Every effort was made to ensure accuracy; however, inadvertent  computerized transcription errors may be present.   Final Clinical Impression(s) / ED Diagnoses Final diagnoses:  Uncontrolled hypertension  Acute right ankle pain    Rx / DC Orders ED Discharge Orders          Ordered    amLODipine (NORVASC) 10 MG tablet  Daily        11/11/22 1334              Achille Rich, PA-C 11/11/22 1419    Loetta Rough, MD 11/11/22 1445

## 2022-11-11 NOTE — ED Notes (Signed)
Pt states hx of HTN, has been out of meds for several months as he has not been able to establish with new PCP.  No neuro symptoms reported.

## 2022-11-11 NOTE — ED Triage Notes (Signed)
Pt ambulatory to triage without difficulty with c/o right ankle pain after injury 2 weeks ago.  Was seen today at [facility] and referred to MRI.  States that imaging center here told him to come to ER.

## 2023-03-19 ENCOUNTER — Other Ambulatory Visit (HOSPITAL_BASED_OUTPATIENT_CLINIC_OR_DEPARTMENT_OTHER): Payer: Self-pay

## 2023-04-01 ENCOUNTER — Other Ambulatory Visit (HOSPITAL_BASED_OUTPATIENT_CLINIC_OR_DEPARTMENT_OTHER): Payer: Self-pay

## 2023-05-21 ENCOUNTER — Other Ambulatory Visit: Payer: Self-pay

## 2023-05-21 NOTE — Progress Notes (Signed)
Pt cleared pre-employment UDS.
